# Patient Record
Sex: Female | Born: 2000 | Hispanic: Yes | Marital: Single | Smoking: Never smoker
Health system: Southern US, Community
[De-identification: ages and names within clinical notes are randomized; demographics above are authoritative.]

## PROBLEM LIST (undated history)

## (undated) ENCOUNTER — Inpatient Hospital Stay (HOSPITAL_COMMUNITY): Payer: Self-pay

## (undated) DIAGNOSIS — D649 Anemia, unspecified: Secondary | ICD-10-CM

## (undated) DIAGNOSIS — I440 Atrioventricular block, first degree: Secondary | ICD-10-CM

## (undated) DIAGNOSIS — R011 Cardiac murmur, unspecified: Secondary | ICD-10-CM

## (undated) DIAGNOSIS — E785 Hyperlipidemia, unspecified: Secondary | ICD-10-CM

## (undated) HISTORY — DX: Atrioventricular block, first degree: I44.0

## (undated) HISTORY — DX: Cardiac murmur, unspecified: R01.1

## (undated) HISTORY — DX: Hyperlipidemia, unspecified: E78.5

## (undated) HISTORY — PX: NO PAST SURGERIES: SHX2092

---
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
## 2018-02-21 ENCOUNTER — Encounter: Payer: Self-pay | Admitting: Family Medicine

## 2018-02-21 ENCOUNTER — Encounter: Payer: Self-pay | Admitting: Licensed Clinical Social Worker

## 2018-02-21 ENCOUNTER — Ambulatory Visit (INDEPENDENT_AMBULATORY_CARE_PROVIDER_SITE_OTHER): Payer: Medicaid Other | Admitting: Family Medicine

## 2018-02-21 VITALS — BP 128/62 | HR 65 | Temp 97.8°F | Wt 143.4 lb

## 2018-02-21 DIAGNOSIS — G4700 Insomnia, unspecified: Secondary | ICD-10-CM | POA: Insufficient documentation

## 2018-02-21 DIAGNOSIS — N912 Amenorrhea, unspecified: Secondary | ICD-10-CM | POA: Diagnosis not present

## 2018-02-21 DIAGNOSIS — Z3202 Encounter for pregnancy test, result negative: Secondary | ICD-10-CM

## 2018-02-21 LAB — POCT URINE PREGNANCY: Preg Test, Ur: NEGATIVE

## 2018-02-21 MED ORDER — NORGESTIMATE-ETH ESTRADIOL 0.25-35 MG-MCG PO TABS: 1.0000 | ORAL_TABLET | Freq: Every day | ORAL | 11 refills | Status: DC

## 2018-02-21 NOTE — Assessment & Plan Note (Signed)
Concern for poor sleep hygiene or depression.  [facility].  Will follow up next week

## 2018-02-21 NOTE — Patient Instructions (Signed)
Good to see you today!  Thanks for coming in.  Take the BCP one every day at the same time.  It will not hurt anything if you take them if you might be pregnant  Use condoms every time you have sex  If you are feeling more down then call us immediately  Come back in one week for a check

## 2018-02-21 NOTE — Progress Notes (Signed)
Subjective  Julie Potter is a 17 y.o. female is presenting with the following  MISSED MP Last menstrual period was 5-6. Usually has them every month.  Did have miscarriage in March but regular menstrual periods in April and May.  Is sex active without contraception. No sores or fever or rash [facility]  INSOMNIA For the last few months.  Taking melatonin and brother's anxiety medicaton.  No suicidal ideation but some anxiety and feeling down - see PHQ( + fhx of bipolar  Chief Complaint noted Review of Symptoms - see HPI PMH - Smoking status noted.  No history of blood clots or migraines or bleeding do or smoking   Objective Vital Signs reviewed BP 128/62   Pulse 65   Temp 97.8 F (36.6 C) (Oral)   Wt 143 lb 6.4 oz (65 kg)   LMP 01/15/2018   SpO2 99%   BMI 25.40 kg/m  Psych:  Cognition and judgment appear intact. Alert, communicative  and cooperative with normal attention span and concentration. No apparent delusions, illusions, hallucinations  Assessments/Plans  See after visit summary for details of patient instuctions  Amenorrhea Either irregular menstrual period or undetected pregnancy.  She desires contraception and is at high risk for unintended pregnancy.  Will start BCPs and see back next week for another preg test   Insomnia Concern for poor sleep hygiene or depression.  [facility].  Will follow up next week

## 2018-02-21 NOTE — Assessment & Plan Note (Signed)
Either irregular menstrual period or undetected pregnancy.  She desires contraception and is at high risk for unintended pregnancy.  Will start BCPs and see back next week for another preg test

## 2018-02-21 NOTE — Progress Notes (Signed)
Type of Service: Integrated Behavioral Health Warm Handoff  Julie CoteBritney M Potter is a 17 y.o. female referred by Dr. Modena Morrowhambless for assistance with symptoms of depression and sleep difficulties Patient was accompanied by mother, patient wanted mom to stay in the room. Patient is pleasant and engaged in conversation. Reports :concerns with Not being able to sleep for the past 3 months, feeling down, feeling anxious, having out burst at times, yelling at brother and mood swings.  Patient reports taking 15 mg of melatonin and was only able to sleep 2 to 3 hours.  Mental Health/ Substance Hx: no history of mental health treatment, has family history of anxiety, depression and bipolar. Denies use of alcohol and drugs Depression screen PHQ 2/9 02/21/2018  Decreased Interest 1  Down, Depressed, Hopeless 1  PHQ - 2 Score 2  Altered sleeping 3  Tired, decreased energy 2  Change in appetite 2  Feeling bad or failure about yourself  1  Trouble concentrating 1  Moving slowly or fidgety/restless 1  Suicidal thoughts 0  PHQ-9 Score 12   Life & Social patient lives with mother and brother  Recent llfe changes: none reported Issues discussed:  Integrated care services, support system, previous and current coping skills,  things patient enjoy or use to enjoy doing, sleep hygiene and demonstration of relaxed breathing.    Intervention: Reflective listening, supportive counseling, solutions focus strategies, community resources, ;Psychoeducation ; Sleep Hygiene Assessment/Plan:.Patient is currently experiencing sleep difficulty with symptoms of depression, which are exacerbated by teenage stressors.  Patient may benefit from, and is in agreement to receive further assessment and brief therapeutic interventions to assist with managing  Her symptoms. Patient will implement sleep hygiene, and relaxed breathing.  Will F/U with PCP in one week and LCSW.   Warm Hand Off Completed.      Julie Hineseborah Egypt Marchiano, LCSW Licensed  Clinical Social Worker [facility]   [phone] 4:35 PM

## 2018-02-28 ENCOUNTER — Ambulatory Visit: Payer: Medicaid Other

## 2018-02-28 ENCOUNTER — Ambulatory Visit: Payer: Medicaid Other | Admitting: Family Medicine

## 2018-05-23 DIAGNOSIS — Z113 Encounter for screening for infections with a predominantly sexual mode of transmission: Secondary | ICD-10-CM | POA: Diagnosis not present

## 2018-05-23 DIAGNOSIS — R3129 Other microscopic hematuria: Secondary | ICD-10-CM | POA: Diagnosis not present

## 2018-05-23 DIAGNOSIS — Z3490 Encounter for supervision of normal pregnancy, unspecified, unspecified trimester: Secondary | ICD-10-CM | POA: Diagnosis not present

## 2018-08-06 DIAGNOSIS — N898 Other specified noninflammatory disorders of vagina: Secondary | ICD-10-CM | POA: Diagnosis not present

## 2018-09-12 NOTE — L&D Delivery Note (Addendum)
Patient is 18 y.o. [id number] [redacted]w[redacted]d admitted for SOL. AROM at 1630. No additional augmentation.    Delivery Note At 6:45 PM a viable female was delivered via Vaginal, Spontaneous (Presentation: Cephalic; DOA).  APGAR: 8, 9; weight  pending.   Placenta status: Spontaneous, intact.  Cord: 3VC.  Anesthesia:  epidural Episiotomy: None Lacerations: 2nd degree;Perineal Suture Repair: 3.0 vicryl Est. Blood Loss (mL):  277cc  Mom to postpartum.  Baby to Couplet care / Skin to Skin.  Upon arrival patient was complete and pushing. She pushed with good maternal effort to deliver a healthy baby girl. Baby delivered without difficulty, was noted to have good tone and place on maternal abdomen for oral suctioning, drying and stimulation. Delayed cord clamping performed. Placenta delivered intact with 3V cord. Vaginal canal and perineum was inspected and found to have a second degree perineal laceration which was sutured and left hemostatic. Pitocin was started and uterus massaged until bleeding slowed. Counts of sharps, instruments, and lap pads were all correct.   [name], MD PGY-2 8/30/20207:26 PM   I was present for the entire delivery of baby and placenta and repair and agree with above.  Tamala Julian, [place], [place] 05/12/2019 8:28 PM

## 2018-09-19 DIAGNOSIS — R300 Dysuria: Secondary | ICD-10-CM | POA: Diagnosis not present

## 2018-09-19 DIAGNOSIS — Z3201 Encounter for pregnancy test, result positive: Secondary | ICD-10-CM | POA: Diagnosis not present

## 2018-09-19 DIAGNOSIS — Z3200 Encounter for pregnancy test, result unknown: Secondary | ICD-10-CM | POA: Diagnosis not present

## 2018-09-19 DIAGNOSIS — Z202 Contact with and (suspected) exposure to infections with a predominantly sexual mode of transmission: Secondary | ICD-10-CM | POA: Diagnosis not present

## 2018-09-21 ENCOUNTER — Emergency Department
Admission: EM | Admit: 2018-09-21 | Discharge: 2018-09-21 | Disposition: A | Discharge status: Home / Self Care | Payer: Medicaid Other | Attending: Emergency Medicine | Admitting: Emergency Medicine

## 2018-09-21 ENCOUNTER — Other Ambulatory Visit: Payer: Self-pay

## 2018-09-21 ENCOUNTER — Encounter: Payer: Self-pay | Admitting: Emergency Medicine

## 2018-09-21 DIAGNOSIS — Z5321 Procedure and treatment not carried out due to patient leaving prior to being seen by health care provider: Secondary | ICD-10-CM | POA: Insufficient documentation

## 2018-09-21 DIAGNOSIS — R1031 Right lower quadrant pain: Secondary | ICD-10-CM | POA: Diagnosis not present

## 2018-09-21 LAB — URINALYSIS, COMPLETE (UACMP) WITH MICROSCOPIC
Bacteria, UA: NONE SEEN
Bilirubin Urine: NEGATIVE
Glucose, UA: NEGATIVE mg/dL
KETONES UR: NEGATIVE mg/dL
Nitrite: NEGATIVE
PH: 7 (ref 5.0–8.0)
Protein, ur: NEGATIVE mg/dL
RBC / HPF: >50 RBC/hpf — ABNORMAL HIGH (ref 0–5)
Specific Gravity, Urine: 1.009 (ref 1.005–1.030)

## 2018-09-21 LAB — CBC
HCT: 37.6 % (ref 36.0–46.0)
Hemoglobin: 12.6 g/dL (ref 12.0–15.0)
MCH: 29 pg (ref 26.0–34.0)
MCHC: 33.5 g/dL (ref 30.0–36.0)
MCV: 86.4 fL (ref 80.0–100.0)
NRBC: 0 % (ref 0.0–0.2)
Platelets: 300 10*3/uL (ref 150–400)
RBC: 4.35 MIL/uL (ref 3.87–5.11)
RDW: 12.2 % (ref 11.5–15.5)
WBC: 8.6 10*3/uL (ref 4.0–10.5)

## 2018-09-21 LAB — BASIC METABOLIC PANEL
ANION GAP: 8 (ref 5–15)
BUN: 7 mg/dL (ref 6–20)
CALCIUM: 8.8 mg/dL — AB (ref 8.9–10.3)
CO2: 21 mmol/L — ABNORMAL LOW (ref 22–32)
Chloride: 104 mmol/L (ref 98–111)
Creatinine, Ser: 0.5 mg/dL (ref 0.44–1.00)
GFR calc Af Amer: >60 mL/min (ref >60)
GLUCOSE: 86 mg/dL (ref 70–99)
Potassium: 3.8 mmol/L (ref 3.5–5.1)
Sodium: 133 mmol/L — ABNORMAL LOW (ref 135–145)

## 2018-09-21 LAB — HCG, QUANTITATIVE, PREGNANCY: hCG, Beta Chain, Quant, S: 55387 m[IU]/mL — ABNORMAL HIGH (ref <5)

## 2018-09-21 NOTE — ED Notes (Signed)
Pt is pregnant but mother who is present does not know.

## 2018-09-21 NOTE — ED Notes (Signed)
Pt unable to provide urine specimen at this time

## 2018-09-21 NOTE — ED Triage Notes (Signed)
Pt arrived via POV with mother with reports of right flank pain that started a few days ago, pt states she was seen at a clinic on 1/8 and was told there was blood in her urine and that she was dehydrated, pt states she did not get started on any antibiotics at this time.  Pt states she woke up today vomiting x 1.   Pt also reports burning in throat x 1 week, using OTC meds with no relief.

## 2018-09-25 ENCOUNTER — Inpatient Hospital Stay (HOSPITAL_COMMUNITY): Payer: Medicaid Other

## 2018-09-25 ENCOUNTER — Other Ambulatory Visit: Payer: Self-pay

## 2018-09-25 ENCOUNTER — Encounter (HOSPITAL_COMMUNITY): Payer: Self-pay | Admitting: *Deleted

## 2018-09-25 ENCOUNTER — Inpatient Hospital Stay (HOSPITAL_COMMUNITY)
Admission: AD | Admit: 2018-09-25 | Discharge: 2018-09-25 | Disposition: A | Discharge status: Home / Self Care | Payer: Medicaid Other | Source: Ambulatory Visit | Attending: Family Medicine | Admitting: Family Medicine

## 2018-09-25 DIAGNOSIS — O209 Hemorrhage in early pregnancy, unspecified: Secondary | ICD-10-CM

## 2018-09-25 DIAGNOSIS — Z3A01 Less than 8 weeks gestation of pregnancy: Secondary | ICD-10-CM | POA: Diagnosis not present

## 2018-09-25 DIAGNOSIS — N898 Other specified noninflammatory disorders of vagina: Secondary | ICD-10-CM | POA: Diagnosis present

## 2018-09-25 DIAGNOSIS — Z3491 Encounter for supervision of normal pregnancy, unspecified, first trimester: Secondary | ICD-10-CM

## 2018-09-25 DIAGNOSIS — O26851 Spotting complicating pregnancy, first trimester: Secondary | ICD-10-CM | POA: Diagnosis not present

## 2018-09-25 DIAGNOSIS — O200 Threatened abortion: Secondary | ICD-10-CM | POA: Diagnosis not present

## 2018-09-25 LAB — CBC
HCT: 35.7 % — ABNORMAL LOW (ref 36.0–46.0)
HEMOGLOBIN: 11.9 g/dL — AB (ref 12.0–15.0)
MCH: 29.2 pg (ref 26.0–34.0)
MCHC: 33.3 g/dL (ref 30.0–36.0)
MCV: 87.5 fL (ref 80.0–100.0)
NRBC: 0 % (ref 0.0–0.2)
PLATELETS: 273 10*3/uL (ref 150–400)
RBC: 4.08 MIL/uL (ref 3.87–5.11)
RDW: 12.6 % (ref 11.5–15.5)
WBC: 9.2 10*3/uL (ref 4.0–10.5)

## 2018-09-25 LAB — URINALYSIS, ROUTINE W REFLEX MICROSCOPIC
Bilirubin Urine: NEGATIVE
GLUCOSE, UA: NEGATIVE mg/dL
Ketones, ur: NEGATIVE mg/dL
Nitrite: NEGATIVE
PROTEIN: NEGATIVE mg/dL
SPECIFIC GRAVITY, URINE: 1.012 (ref 1.005–1.030)
pH: 6 (ref 5.0–8.0)

## 2018-09-25 LAB — WET PREP, GENITAL
CLUE CELLS WET PREP: NONE SEEN
Sperm: NONE SEEN
Yeast Wet Prep HPF POC: NONE SEEN

## 2018-09-25 LAB — POCT PREGNANCY, URINE: Preg Test, Ur: POSITIVE — AB

## 2018-09-25 LAB — HCG, QUANTITATIVE, PREGNANCY: hCG, Beta Chain, Quant, S: 102219 m[IU]/mL — ABNORMAL HIGH (ref <5)

## 2018-09-25 MED ORDER — PRENATAL VITAMINS 0.8 MG PO TABS: 1.0000 | ORAL_TABLET | Freq: Every day | ORAL | 12 refills | Status: DC

## 2018-09-25 NOTE — Discharge Instructions (Signed)
[facility] [employer] for [employer] at [facility]       Phone: [phone]  Center for [employer] at [place] Phone: [phone]  Center for [employer] at [place]  Phone: [phone]  Center for [employer] at [employer]  Phone: [phone]  Center for [facility] at [place]  Phone: [phone]  [facility]       Phone: [phone]  [facility]    Phone: [phone]   [facility] [place])    Phone: [phone]  [facility]    Phone: [phone]  [facility]    Phone: [phone]  [facility]    Phone: [phone]  [facility]       Phone: [phone]   [facility]  Phone: [phone]  [facility]    Phone: [phone]  [facility] [place]   Phone: [phone]  [facility]      Phone: [phone]  [facility]    Phone: [phone]    Threatened Miscarriage  A threatened miscarriage is when you have bleeding from your vagina during the first 20 weeks of pregnancy but the pregnancy does not end. Your doctor will do tests to make sure you are still pregnant. The cause of the bleeding may not be known. This condition does not mean your pregnancy will end, but it does increase the risk that it will end (complete miscarriage). Follow these instructions at home:  Get plenty of rest.  If you have bleeding in your vagina, do not have sex or use tampons.  Do not douche.  Do not smoke or use drugs.  Do not drink alcohol.  Avoid caffeine.  Keep all follow-up prenatal visits as told by your doctor. This is important. Contact a doctor if:  You have light bleeding from your vagina.  You have belly pain or cramping.  You have a  fever. Get help right away if:  You have heavy bleeding from your vagina.  You have clots of blood coming from your vagina.  You pass tissue from your vagina.  You have a gush of fluid from your vagina.  You are leaking fluid from your vagina.  You have very bad pain or cramps in your low back or belly.  You have fever, chills, and very bad belly pain. Summary  A threatened miscarriage is when you have bleeding from your vagina during the first 20 weeks of pregnancy but the pregnancy does not end.  This condition does not mean your pregnancy will end, but it does increase the risk that it will end (complete miscarriage).  Get plenty of rest. If you have bleeding in your vagina, do not have sex or use tampons.  Keep all follow-up prenatal visits as told by your doctor. This is important. This information is not intended to replace advice given to you by your health care provider. Make sure you discuss any questions you have with your health care provider. Document Released: 08/11/2008 Document Revised: 11/25/2016 Document Reviewed: 11/25/2016 Elsevier Interactive Patient Education  2019 [employer].

## 2018-09-25 NOTE — MAU Provider Note (Addendum)
[facility] Provider Note   CSN: [id number] Arrival date & time: 09/25/18  1849     History   Chief Complaint No chief complaint on file.   HPI Julie Potter is a 18 y.o. who presents to the MAU with concern about increased discharge. Patient reports having 2 abortions last year. She is concerned that something is wrong. Patient reports going to the [facility] emergency clinic 09/19/2018 and had STD testing that was negative. Patient reports that 2 days later 1/10 she was evaluated in the ED at [facility] for n/v. She reports they did not give her medications or do any treatment in the ED just d/c her home with instructions to PO hydrate. Patient c/o low back pain and vaginal spotting this morning. Last sexual intercourse was this morning.   HPI  Past Medical History:  Diagnosis Date  . Heart murmur     Patient Active Problem List   Diagnosis Date Noted  . Amenorrhea 02/21/2018  . Insomnia 02/21/2018  . CARDIAC MURMUR 11/29/2007    No past surgical history on file.   OB History    Gravida  3   Para      Term      Preterm      AB  2   Living  0     SAB      TAB  2   Ectopic      Multiple      Live Births               Home Medications    Prior to Admission medications   Medication Sig Start Date End Date Taking? Authorizing Provider  famotidine (PEPCID) 20 MG tablet Take 1 tablet (20 mg total) daily by mouth. 07/19/17   Carney Living, MD  norgestimate-ethinyl estradiol (ORTHO-CYCLEN,SPRINTEC,PREVIFEM) 0.25-35 MG-MCG tablet Take 1 tablet by mouth daily. 02/21/18   Carney Living, MD    Family History Family History  Problem Relation Age of Onset  . Hyperlipidemia Mother   . Hyperlipidemia Maternal Grandmother   . COPD Maternal Grandmother   . Heart disease Maternal Grandmother   . Diabetes Paternal Grandfather     Social History Social History   Tobacco Use  . Smoking status: Passive Smoke Exposure - Never  Smoker  . Smokeless tobacco: Never Used  Substance Use Topics  . Alcohol use: No  . Drug use: No     Allergies   Patient has no known allergies.   Review of Systems Review of Systems  Constitutional: Negative for chills and fever.  HENT: Negative.   Eyes: Negative for visual disturbance.  Respiratory: Negative for cough and shortness of breath.   Cardiovascular: Negative for chest pain.  Gastrointestinal: Negative for abdominal pain, nausea and vomiting.  Genitourinary: Positive for vaginal discharge. Negative for dysuria, frequency and urgency. Vaginal bleeding: pink spotting.     Physical Exam Updated Vital Signs BP (!) 125/57 (BP Location: Right Arm)   Pulse 66   Temp 98.4 F (36.9 C) (Oral)   Resp 16   Ht 5\' 4"  (1.626 m)   Wt 61.8 kg   LMP 07/29/2018 (Exact Date)   SpO2 100%   BMI 23.39 kg/m   Physical Exam Vitals signs and nursing note reviewed.  Constitutional:      Appearance: She is well-developed.  HENT:     Head: Normocephalic.  Eyes:     Conjunctiva/sclera: Conjunctivae normal.  Neck:     Musculoskeletal: Neck supple.  Cardiovascular:     Rate and Rhythm: Normal rate.  Pulmonary:     Effort: Pulmonary effort is normal.  Abdominal:     Palpations: Abdomen is soft.     Tenderness: There is no abdominal tenderness.  Genitourinary:    Comments: External genitalia without lesions, yellow d/c vaginal vault, no blood noted, no CMT, no adnexal tenderness, uterus slightly enlarged.  Musculoskeletal: Normal range of motion.  Skin:    General: Skin is warm and dry.  Neurological:     Mental Status: She is alert and oriented to person, place, and time.     Cranial Nerves: No cranial nerve deficit.  Psychiatric:        Mood and Affect: Mood normal.      ED Treatments / Results  Labs (all labs ordered are listed, but only abnormal results are displayed) Labs Reviewed  WET PREP, GENITAL - Abnormal; Notable for the following components:      Result  Value   Trich, Wet Prep PRESENT (*)    WBC, Wet Prep HPF POC MANY (*)    All other components within normal limits  URINALYSIS, ROUTINE W REFLEX MICROSCOPIC - Abnormal; Notable for the following components:   Hgb urine dipstick LARGE (*)    Leukocytes, UA MODERATE (*)    Bacteria, UA RARE (*)    All other components within normal limits  CBC - Abnormal; Notable for the following components:   Hemoglobin 11.9 (*)    HCT 35.7 (*)    All other components within normal limits  HCG, QUANTITATIVE, PREGNANCY - Abnormal; Notable for the following components:   hCG, Beta Chain, Quant, S 102,219 (*)    All other components within normal limits  POCT PREGNANCY, URINE - Abnormal; Notable for the following components:   Preg Test, Ur POSITIVE (*)    All other components within normal limits  ABO/RH  A pos Radiology [place] Ob Less Than 14 Weeks With Ob Transvaginal  Result Date: 09/25/2018 CLINICAL DATA:  18 year old female with back pain for 2 days and light spotting. Estimated gestational age by LMP 8 weeks 2 days. EXAM: OBSTETRIC <14 WK US AND TRANSVAGINAL OB US TECHNIQUE: Both transabdominal and transvaginal ultrasound examinations were performed for complete evaluation of the gestation as well as the maternal uterus, adnexal regions, and pelvic cul-de-sac. Transvaginal technique was performed to assess early pregnancy. COMPARISON:  None. FINDINGS: Intrauterine gestational sac: Single Yolk sac:  Visible Embryo:  Visible Cardiac Activity: Detected Heart Rate: 132 bpm CRL:  7.5 mm   6 w   4 d                  US EDC: 05/17/2019 Subchorionic hemorrhage:  None visualized. Maternal uterus/adnexae: The right ovary appears normal measuring 2.6 x 3.7 x 1.8 cm. The left ovary appears normal measuring 3.1 x 3.7 x 2.1 centimeters. No pelvic free fluid. IMPRESSION: Single living IUP demonstrated. No acute maternal findings visualized. Electronically Signed   By: Odessa FlemingH  Hall M.D.   On: 09/25/2018 20:58     Procedures Procedures (including critical care time)  Initial Impression / Assessment and Plan / ED Course  I have reviewed the triage vital signs and the nursing notes. 18 y.o. female @ 6.[redacted] weeks gestation stable for d/c with IUP visible on u/s. Patient to start prenatal care. Dr. Shawnie PonsPratt to assume care of patient. Patient awaiting lab results. A pos--ok for discharge Begin [facility] OB provider list given  Final Clinical Impressions(s) / ED Diagnoses  Normal IUP (intrauterine pregnancy) on prenatal ultrasound, first trimester - Plan: Discharge patient  Bleeding in early pregnancy - Plan: US OB LESS THAN 14 WEEKS WITH OB TRANSVAGINAL, US OB LESS THAN 14 WEEKS WITH OB TRANSVAGINAL  Threatened abortion  Allergies as of 09/25/2018   No Known Allergies     Medication List    STOP taking these medications   famotidine 20 MG tablet Commonly known as:  PEPCID   norgestimate-ethinyl estradiol 0.25-35 MG-MCG tablet Commonly known as:  ORTHO-CYCLEN,SPRINTEC,PREVIFEM     TAKE these medications   Prenatal Vitamins 0.8 MG tablet Take 1 tablet by mouth daily.

## 2018-09-25 NOTE — MAU Note (Addendum)
Thought she had an STD, because she a lot of d/c - had an odor.   Was seen, cultures were neg, preg confirmed.  Her mom is concerned,  Because she had 2 abortions last year, thought it might be the plug.  They just think something has to wrong because of all the d/c. No itching or vag pain.  Is having a lot of pain in her low back, started 2 days, mostly when she is sitting down.  This morning she had some light spotting, none now.  Slight left sided CVA tenderness.

## 2018-09-26 LAB — ABO/RH: ABO/RH(D): A POS

## 2018-11-08 ENCOUNTER — Other Ambulatory Visit: Payer: Self-pay

## 2018-11-08 ENCOUNTER — Inpatient Hospital Stay (HOSPITAL_COMMUNITY)
Admission: AD | Admit: 2018-11-08 | Discharge: 2018-11-08 | Disposition: A | Discharge status: Home / Self Care | Payer: Medicaid Other | Attending: Obstetrics & Gynecology | Admitting: Obstetrics & Gynecology

## 2018-11-08 ENCOUNTER — Encounter (HOSPITAL_COMMUNITY): Payer: Self-pay

## 2018-11-08 DIAGNOSIS — B9689 Other specified bacterial agents as the cause of diseases classified elsewhere: Secondary | ICD-10-CM | POA: Diagnosis not present

## 2018-11-08 DIAGNOSIS — R109 Unspecified abdominal pain: Secondary | ICD-10-CM | POA: Diagnosis not present

## 2018-11-08 DIAGNOSIS — Z3A12 12 weeks gestation of pregnancy: Secondary | ICD-10-CM | POA: Insufficient documentation

## 2018-11-08 DIAGNOSIS — Z79899 Other long term (current) drug therapy: Secondary | ICD-10-CM | POA: Diagnosis not present

## 2018-11-08 DIAGNOSIS — N760 Acute vaginitis: Secondary | ICD-10-CM

## 2018-11-08 DIAGNOSIS — Z7722 Contact with and (suspected) exposure to environmental tobacco smoke (acute) (chronic): Secondary | ICD-10-CM | POA: Diagnosis not present

## 2018-11-08 DIAGNOSIS — O9989 Other specified diseases and conditions complicating pregnancy, childbirth and the puerperium: Secondary | ICD-10-CM | POA: Insufficient documentation

## 2018-11-08 DIAGNOSIS — O98811 Other maternal infectious and parasitic diseases complicating pregnancy, first trimester: Secondary | ICD-10-CM | POA: Diagnosis not present

## 2018-11-08 DIAGNOSIS — O26891 Other specified pregnancy related conditions, first trimester: Secondary | ICD-10-CM | POA: Diagnosis not present

## 2018-11-08 DIAGNOSIS — O26899 Other specified pregnancy related conditions, unspecified trimester: Secondary | ICD-10-CM

## 2018-11-08 DIAGNOSIS — Z833 Family history of diabetes mellitus: Secondary | ICD-10-CM | POA: Diagnosis not present

## 2018-11-08 LAB — URINALYSIS, ROUTINE W REFLEX MICROSCOPIC
Bilirubin Urine: NEGATIVE
GLUCOSE, UA: NEGATIVE mg/dL
Ketones, ur: NEGATIVE mg/dL
Nitrite: NEGATIVE
PROTEIN: NEGATIVE mg/dL
Specific Gravity, Urine: 1.02 (ref 1.005–1.030)
pH: 6 (ref 5.0–8.0)

## 2018-11-08 LAB — WET PREP, GENITAL
Sperm: NONE SEEN
Trich, Wet Prep: NONE SEEN
Yeast Wet Prep HPF POC: NONE SEEN

## 2018-11-08 MED ORDER — METRONIDAZOLE 500 MG PO TABS: 500.0000 mg | ORAL_TABLET | Freq: Two times a day (BID) | ORAL | 0 refills | Status: AC

## 2018-11-08 NOTE — Discharge Instructions (Signed)
Abdominal Pain During Pregnancy  Belly (abdominal) pain is common during pregnancy. There are many possible causes. Most of the time, it is not a serious problem. Other times, it can be a sign that something is wrong with the pregnancy. Always tell your doctor if you have belly pain. Follow these instructions at home:  Do not have sex or put anything in your vagina until your pain goes away completely.  Get plenty of rest until your pain gets better.  Drink enough fluid to keep your pee (urine) pale yellow.  Take over-the-counter and prescription medicines only as told by your doctor.  Keep all follow-up visits as told by your doctor. This is important. Contact a doctor if:  Your pain continues or gets worse after resting.  You have lower belly pain that: ? Comes and goes at regular times. ? Spreads to your back. ? Feels like menstrual cramps.  You have pain or burning when you pee (urinate). Get help right away if:  You have a fever or chills.  You have vaginal bleeding.  You are leaking fluid from your vagina.  You are passing tissue from your vagina.  You throw up (vomit) for more than 24 hours.  You have watery poop (diarrhea) for more than 24 hours.  Your baby is moving less than usual.  You feel very weak or faint.  You have shortness of breath.  You have very bad pain in your upper belly. Summary  Belly (abdominal) pain is common during pregnancy. There are many possible causes.  If you have belly pain during pregnancy, tell your doctor right away.  Keep all follow-up visits as told by your doctor. This is important. This information is not intended to replace advice given to you by your health care provider. Make sure you discuss any questions you have with your health care provider. Document Released: 08/17/2009 Document Revised: 12/01/2016 Document Reviewed: 12/01/2016 Elsevier Interactive Patient Education  2019 Elsevier Inc.  Bacterial  Vaginosis  Bacterial vaginosis is an infection of the vagina. It happens when too many normal germs (healthy bacteria) grow in the vagina. This infection puts you at risk for infections from sex (STIs). Treating this infection can lower your risk for some STIs. You should also treat this if you are pregnant. It can cause your baby to be born early. Follow these instructions at home: Medicines  Take over-the-counter and prescription medicines only as told by your doctor.  Take or use your antibiotic medicine as told by your doctor. Do not stop taking or using it even if you start to feel better. General instructions  If you your sexual partner is a woman, tell her that you have this infection. She needs to get treatment if she has symptoms. If you have a female partner, he does not need to be treated.  During treatment: ? Avoid sex. ? Do not douche. ? Avoid alcohol as told. ? Avoid breastfeeding as told.  Drink enough fluid to keep your pee (urine) clear or pale yellow.  Keep your vagina and butt (rectum) clean. ? Wash the area with warm water every day. ? Wipe from front to back after you use the toilet.  Keep all follow-up visits as told by your doctor. This is important. Preventing this condition  Do not douche.  Use only warm water to wash around your vagina.  Use protection when you have sex. This includes: ? Latex condoms. ? Dental dams.  Limit how many people you have sex with.  It is best to only have sex with the same person (be monogamous).  Get tested for STIs. Have your partner get tested.  Wear underwear that is cotton or lined with cotton.  Avoid tight pants and pantyhose. This is most important in summer.  Do not use any products that have nicotine or tobacco in them. These include cigarettes and e-cigarettes. If you need help quitting, ask your doctor.  Do not use illegal drugs.  Limit how much alcohol you drink. Contact a doctor if:  Your symptoms do  not get better, even after you are treated.  You have more discharge or pain when you pee (urinate).  You have a fever.  You have pain in your belly (abdomen).  You have pain with sex.  Your bleed from your vagina between periods. Summary  This infection happens when too many germs (bacteria) grow in the vagina.  Treating this condition can lower your risk for some infections from sex (STIs).  You should also treat this if you are pregnant. It can cause early (premature) birth.  Do not stop taking or using your antibiotic medicine even if you start to feel better. This information is not intended to replace advice given to you by your health care provider. Make sure you discuss any questions you have with your health care provider. Document Released: 06/07/2008 Document Revised: 05/14/2016 Document Reviewed: 05/14/2016 Elsevier Interactive Patient Education  2019 [employer].  Prenatal Care Providers [facility]    [facility]  [phone]     Phone: [phone]          [facility]                      [facility]  @[address]     Phone: [phone]  Phone: [phone]         [facility]     Phone: [phone]  Phone: [phone]           [facility] [place]                hone: [phone]  Phone: [phone]         [facility] Dr. Francoise Ceo      Phone: [phone]  Phone: [phone]         [facility] Dept.                Phone: [phone]  [facility]   [address] [place])          Phone: [phone] [facility]   Phone: [phone]

## 2018-11-08 NOTE — MAU Note (Signed)
Pt presents to MAU with c/o lower abdominal and back pain that started x 5 days. Pt denies VB.

## 2018-11-08 NOTE — MAU Provider Note (Addendum)
History     CSN: [id number]  Arrival date and time: 11/08/18 1803   History obtained from patient.     Chief Complaint  Patient presents with  . Abdominal Pain  . Back Pain   Abdominal Pain  Pertinent negatives include no constipation, diarrhea, dysuria, fever, hematuria or nausea.  Back Pain  Associated symptoms include abdominal pain. Pertinent negatives include no chest pain, dysuria or fever.    Julie Potter [id number] is a 18 year old female who presents to the MAU with a chief complaint of bilateral lower quadrant pain, low back pain, and white malodorous vaginal discharge. Patient is [redacted]w[redacted]d weeks pregnant based on ultrasound performed on 09/25/2018. Pain is intermittent and described as "cramp like." Rates pain 6/10. Patient has taken tylenol extra strength with some relief. Tylenol brings her pain level down to 2/10. Patient states that the malodorous white discharge has been and ongoing problem since December 2019. Patient states that she went to an urgent care in December to get evaluated for an STD, and was told that everything came back normal. Patient denies fever, chills, nausea, vomiting, flank pain, dysuria, and urgency.   IUP confirmed on [place] 09/25/2018. Patient is taking prenatal vitamins and states that she has established prenatal care.    OB History    Gravida  3   Para      Term      Preterm      AB  2   Living  0     SAB      TAB  2   Ectopic      Multiple      Live Births              Past Medical History:  Diagnosis Date  . Heart murmur     History reviewed. No pertinent surgical history.  Family History  Problem Relation Age of Onset  . Hyperlipidemia Mother   . Hyperlipidemia Maternal Grandmother   . COPD Maternal Grandmother   . Heart disease Maternal Grandmother   . Diabetes Paternal Grandfather     Social History   Tobacco Use  . Smoking status: Passive Smoke Exposure - Never Smoker  . Smokeless tobacco: Never Used   Substance Use Topics  . Alcohol use: No  . Drug use: No    Allergies: No Known Allergies  Medications Prior to Admission  Medication Sig Dispense Refill Last Dose  . Prenatal Multivit-Min-Fe-FA (PRENATAL VITAMINS) 0.8 MG tablet Take 1 tablet by mouth daily. 30 tablet 12    Results for orders placed or performed during the hospital encounter of 11/08/18 (from the past 48 hour(s))  Urinalysis, Routine w reflex microscopic     Status: Abnormal   Collection Time: 11/08/18  6:33 PM  Result Value Ref Range   Color, Urine YELLOW YELLOW   APPearance HAZY (A) CLEAR   Specific Gravity, Urine 1.020 1.005 - 1.030   pH 6.0 5.0 - 8.0   Glucose, UA NEGATIVE NEGATIVE mg/dL   Hgb urine dipstick LARGE (A) NEGATIVE   Bilirubin Urine NEGATIVE NEGATIVE   Ketones, ur NEGATIVE NEGATIVE mg/dL   Protein, ur NEGATIVE NEGATIVE mg/dL   Nitrite NEGATIVE NEGATIVE   Leukocytes,Ua MODERATE (A) NEGATIVE   RBC / HPF 0-5 0 - 5 RBC/hpf   WBC, UA 11-20 0 - 5 WBC/hpf   Bacteria, UA RARE (A) NONE SEEN   Squamous Epithelial / LPF 11-20 0 - 5   Mucus PRESENT     Comment: Performed  at [facility], [address] [place], [place] [address]  Wet prep, genital     Status: Abnormal   Collection Time: 11/08/18  7:21 PM  Result Value Ref Range   Yeast Wet Prep HPF POC NONE SEEN NONE SEEN   Trich, Wet Prep NONE SEEN NONE SEEN   Clue Cells Wet Prep HPF POC PRESENT (A) NONE SEEN   WBC, Wet Prep HPF POC MANY (A) NONE SEEN    Comment: MANY BACTERIA SEEN   Sperm NONE SEEN     Comment: Performed at [facility], [address] [place], [place] [address]   Review of Systems  Constitutional: Negative for chills and fever.  Respiratory: Negative for shortness of breath.   Cardiovascular: Negative for chest pain.  Gastrointestinal: Positive for abdominal pain. Negative for constipation, diarrhea and nausea.  Genitourinary: Positive for vaginal discharge (white and malodorous). Negative for dysuria, flank pain,  hematuria, urgency and vaginal bleeding.  Musculoskeletal: Positive for back pain.  Neurological: Negative for dizziness.   Physical Exam   Blood pressure (!) 135/55, pulse 83, temperature 97.8 F (36.6 C), temperature source Oral, resp. rate 18, height 5\' 4"  (1.626 m), weight 63.5 kg, last menstrual period 07/29/2018, SpO2 100 %.  Physical Exam  Constitutional: She appears well-developed and well-nourished. No distress.  GI: Soft. She exhibits no distension. There is no abdominal tenderness. There is no rebound and no guarding.  Skin: She is not diaphoretic.    MAU Course  Procedures  None  MDM   Assessment and Plan    Julie Potter 11/08/2018, 8:42 PM   I confirm that I have verified the information documented in the physician assistant student's note and that I have also personally reperformed the history, physical exam and all medical decision making activities of this service and have verified that all service and findings are accurately documented in this student's note.   (add any additional history, PE or MDM you re-performed)  Julie Potter, Julie Rutherford, NP 11/08/2018 8:42 PM   SIUP@ 12 weeks + fetal heart tones via doppler  A positive blood type  Abdomen non tender Cervix closed, thick posterior. Wet prep, GC and urine culture collected  A:  1. Abdominal pain during pregnancy, antepartum   2. Bacterial vaginosis     P:  Discharge home in stable condition Return to MAU if symptoms worsen F/u with OB as directed

## 2018-11-09 LAB — CULTURE, OB URINE
CULTURE: NO GROWTH
SPECIAL REQUESTS: NORMAL

## 2018-11-09 LAB — GC/CHLAMYDIA PROBE AMP (~~LOC~~) NOT AT ARMC
Chlamydia: NEGATIVE
Neisseria Gonorrhea: NEGATIVE

## 2018-11-29 ENCOUNTER — Encounter: Payer: Self-pay | Admitting: Certified Nurse Midwife

## 2018-11-29 ENCOUNTER — Other Ambulatory Visit: Payer: Self-pay

## 2018-11-29 ENCOUNTER — Ambulatory Visit (INDEPENDENT_AMBULATORY_CARE_PROVIDER_SITE_OTHER): Payer: Medicaid Other | Admitting: Certified Nurse Midwife

## 2018-11-29 VITALS — BP 111/64 | HR 84 | Wt 136.0 lb

## 2018-11-29 DIAGNOSIS — Z23 Encounter for immunization: Secondary | ICD-10-CM

## 2018-11-29 DIAGNOSIS — Z3480 Encounter for supervision of other normal pregnancy, unspecified trimester: Secondary | ICD-10-CM | POA: Diagnosis not present

## 2018-11-29 DIAGNOSIS — Z3A15 15 weeks gestation of pregnancy: Secondary | ICD-10-CM

## 2018-11-29 DIAGNOSIS — Z3482 Encounter for supervision of other normal pregnancy, second trimester: Secondary | ICD-10-CM | POA: Diagnosis not present

## 2018-11-29 NOTE — Progress Notes (Signed)
History:   Julie Potter is a 18 y.o. G3P0020 at [redacted]w[redacted]d by LMP being seen today for her first obstetrical visit.  Her obstetrical history is significant for none. Patient does intend to breast feed. Pregnancy history fully reviewed.  Patient reports no complaints.  HISTORY: OB History  Gravida Para Term Preterm AB Living  3 0 0 0 2 0  SAB TAB Ectopic Multiple Live Births  0 2 0 0 0    # Outcome Date GA Lbr Len/2nd Weight Sex Delivery Anes PTL Lv  3 Current           2 TAB           1 TAB             She is <21yo, does not need pap smear   Past Medical History:  Diagnosis Date  . Heart murmur    Past Surgical History:  Procedure Laterality Date  . NO PAST SURGERIES     Family History  Problem Relation Age of Onset  . Hyperlipidemia Mother   . Hyperlipidemia Maternal Grandmother   . COPD Maternal Grandmother   . Heart disease Maternal Grandmother   . Diabetes Paternal Grandfather    Social History   Tobacco Use  . Smoking status: Passive Smoke Exposure - Never Smoker  . Smokeless tobacco: Never Used  Substance Use Topics  . Alcohol use: No  . Drug use: No   No Known Allergies Current Outpatient Medications on File Prior to Visit  Medication Sig Dispense Refill  . Prenatal Multivit-Min-Fe-FA (PRENATAL VITAMINS) 0.8 MG tablet Take 1 tablet by mouth daily. 30 tablet 12   No current facility-administered medications on file prior to visit.     Review of Systems Pertinent items noted in HPI and remainder of comprehensive ROS otherwise negative. Physical Exam:   Vitals:   11/29/18 1510  BP: 111/64  Pulse: 84  Weight: 136 lb (61.7 kg)   Fetal Heart Rate (bpm): 150 System: General: well-developed, well-nourished female in no acute distress   Skin: normal coloration and turgor, no rashes   Neurologic: oriented, normal, negative, normal mood   Extremities: normal strength, tone, and muscle mass, ROM of all joints is normal   HEENT PERRLA, extraocular  movement intact and sclera clear, anicteric   Mouth/Teeth mucous membranes moist, pharynx normal without lesions and dental hygiene good   Neck supple and no masses   Cardiovascular: regular rate and rhythm   Respiratory:  no respiratory distress, normal breath sounds   Abdomen: soft, non-tender; bowel sounds normal; no masses,  no organomegaly   Assessment:    Pregnancy: [id number] Patient Active Problem List   Diagnosis Date Noted  . Supervision of other normal pregnancy, antepartum 11/29/2018  . Amenorrhea 02/21/2018  . Insomnia 02/21/2018  . CARDIAC MURMUR 11/29/2007     Plan:    1. Supervision of other normal pregnancy, antepartum - Routine prenatal care  - Anticipatory guidance on upcoming appointment  - Educated and discussed use of babyscripts  - recent wet prep and GC/C at hospital not needed today  - Obstetric Panel, Including HIV - Genetic Screening - Enroll Patient in Babyscripts - [place] MFM OB COMP + 14 WK; Future - AFP, Serum, Open Spina Bifida - HgB A1c  2. Need for immunization against influenza - Flu Vaccine QUAD 36+ mos IM  Initial labs drawn. Continue prenatal vitamins. Genetic Screening discussed, NIPS: ordered. Ultrasound discussed; fetal anatomic survey: ordered. Problem list reviewed and updated. The  nature of [facility] with multiple MDs and other Advanced Practice Providers was explained to patient; also emphasized that residents, students are part of our team. Routine obstetric precautions reviewed. Return in about 4 weeks (around 12/27/2018) for ROB.     Sharyon Cable, CNM [facility] [employer], [facility]

## 2018-11-29 NOTE — Patient Instructions (Addendum)
Safe Medications in Pregnancy   Acne: Benzoyl Peroxide Salicylic Acid  Backache/Headache: Tylenol: 2 regular strength every 4 hours OR              2 Extra strength every 6 hours  Colds/Coughs/Allergies: Benadryl (alcohol free) 25 mg every 6 hours as needed Breath right strips Claritin Cepacol throat lozenges Chloraseptic throat spray Cold-Eeze- up to three times per day Cough drops, alcohol free Flonase (by prescription only) Guaifenesin Mucinex Robitussin DM (plain only, alcohol free) Saline nasal spray/drops Sudafed (pseudoephedrine) & Actifed ** use only after [redacted] weeks gestation and if you do not have high blood pressure Tylenol Vicks Vaporub Zinc lozenges Zyrtec   Constipation: Colace Ducolax suppositories Fleet enema Glycerin suppositories Metamucil Milk of magnesia Miralax Senokot Smooth move tea  Diarrhea: Kaopectate Imodium A-D  *NO pepto Bismol  Hemorrhoids: Anusol Anusol HC Preparation H Tucks  Indigestion: Tums Maalox Mylanta Zantac  Pepcid  Insomnia: Benadryl (alcohol free) 25mg  every 6 hours as needed Tylenol PM Unisom, no Gelcaps  Leg Cramps: Tums MagGel  Nausea/Vomiting:  Bonine Dramamine Emetrol Ginger extract Sea bands Meclizine  Nausea medication to take during pregnancy:  Unisom (doxylamine succinate 25 mg tablets) Take one tablet daily at bedtime. If symptoms are not adequately controlled, the dose can be increased to a maximum recommended dose of two tablets daily (1/2 tablet in the morning, 1/2 tablet mid-afternoon and one at bedtime). Vitamin B6 100mg  tablets. Take one tablet twice a day (up to 200 mg per day).  Skin Rashes: Aveeno products Benadryl cream or 25mg  every 6 hours as needed Calamine Lotion 1% cortisone cream  Yeast infection: Gyne-lotrimin 7 Monistat 7   **If taking multiple medications, please check labels to avoid duplicating the same active ingredients **take medication as directed on  the label ** Do not exceed 4000 mg of tylenol in 24 hours **Do not take medications that contain aspirin or ibuprofen    Influenza (Flu) Vaccine (Inactivated or Recombinant): What You Need to Know  1. Why get vaccinated? Influenza vaccine can prevent influenza (flu). Flu is a contagious disease that spreads around the [place] every year, usually between October and May. Anyone can get the flu, but it is more dangerous for some people. Infants and young children, people 35 years of age and older, pregnant women, and people with certain health conditions or a weakened immune system are at greatest risk of flu complications. Pneumonia, bronchitis, sinus infections and ear infections are examples of flu-related complications. If you have a medical condition, such as heart disease, cancer or diabetes, flu can make it worse. Flu can cause fever and chills, sore throat, muscle aches, fatigue, cough, headache, and runny or stuffy nose. Some people may have vomiting and diarrhea, though this is more common in children than adults. Each year thousands of people in the [place] die from flu, and many more are hospitalized. Flu vaccine prevents millions of illnesses and flu-related visits to the doctor each year. 2. Influenza vaccine CDC recommends everyone 43 months of age and older get vaccinated every flu season. Children 6 months through 1 years of age may need 2 doses during a single flu season. Everyone else needs only 1 dose each flu season. It takes about 2 weeks for protection to develop after vaccination. There are many flu viruses, and they are always changing. Each year a new flu vaccine is made to protect against three or four viruses that are likely to cause disease in the upcoming flu season.  Even when the vaccine doesn't exactly match these viruses, it may still provide some protection. Influenza vaccine does not cause flu. Influenza vaccine may be given at the same time as other  vaccines. 3. Talk with your health care provider Tell your vaccine provider if the person getting the vaccine:  Has had an allergic reaction after a previous dose of influenza vaccine, or has any severe, life-threatening allergies.  Has ever had Guillain-Barr Syndrome (also called GBS). In some cases, your health care provider may decide to postpone influenza vaccination to a future visit. People with minor illnesses, such as a cold, may be vaccinated. People who are moderately or severely ill should usually wait until they recover before getting influenza vaccine. Your health care provider can give you more information. 4. Risks of a vaccine reaction  Soreness, redness, and swelling where shot is given, fever, muscle aches, and headache can happen after influenza vaccine.  There may be a very small increased risk of Guillain-Barr Syndrome (GBS) after inactivated influenza vaccine (the flu shot). Young children who get the flu shot along with pneumococcal vaccine (PCV13), and/or DTaP vaccine at the same time might be slightly more likely to have a seizure caused by fever. Tell your health care provider if a child who is getting flu vaccine has ever had a seizure. People sometimes faint after medical procedures, including vaccination. Tell your provider if you feel dizzy or have vision changes or ringing in the ears. As with any medicine, there is a very remote chance of a vaccine causing a severe allergic reaction, other serious injury, or death. 5. What if there is a serious problem? An allergic reaction could occur after the vaccinated person leaves the clinic. If you see signs of a severe allergic reaction (hives, swelling of the face and throat, difficulty breathing, a fast heartbeat, dizziness, or weakness), call 9-1-1 and get the person to the nearest hospital. For other signs that concern you, call your health care provider. Adverse reactions should be reported to the Vaccine Adverse  Event Reporting System (VAERS). Your health care provider will usually file this report, or you can do it yourself. Visit the VAERS website at www.vaers.hhs.gov or call 1-800-822-7967.VAERS is only for reporting reactions, and VAERS staff do not give medical advice. 6. The National Vaccine Injury Compensation Program The National Vaccine Injury Compensation Program (VICP) is a federal program that was created to compensate people who may have been injured by certain vaccines. Visit the VICP website at www.hrsa.gov/vaccinecompensation or call 1-800-338-2382 to learn about the program and about filing a claim. There is a time limit to file a claim for compensation. 7. How can I learn more?  Ask your healthcare provider.  Call your local or state health department.  Contact the Centers for Disease Control and Prevention (CDC): ? Call 1-800-232-4636 (1-800-CDC-INFO) or ? Visit CDC's www.cdc.gov/flu Vaccine Information Statement (Interim) Inactivated Influenza Vaccine (04/26/2018) This information is not intended to replace advice given to you by your health care provider. Make sure you discuss any questions you have with your health care provider. Document Released: 06/23/2006 Document Revised: 04/30/2018 Document Reviewed: 04/30/2018 Elsevier Interactive Patient Education  2019 Elsevier Inc.  

## 2018-12-01 LAB — AFP, SERUM, OPEN SPINA BIFIDA
AFP MoM: 0.7
AFP Value: 23.4 ng/mL
Gest. Age on Collection Date: 15.6 weeks
Maternal Age At EDD: 20.2 yr
OSBR Risk 1 IN: 10000
Test Results:: NEGATIVE
Weight: 136 [lb_av]

## 2018-12-01 LAB — OBSTETRIC PANEL, INCLUDING HIV
Antibody Screen: NEGATIVE
Basophils Absolute: 0.1 10*3/uL (ref 0.0–0.2)
Basos: 1 %
EOS (ABSOLUTE): 0.1 10*3/uL (ref 0.0–0.4)
Eos: 1 %
HIV Screen 4th Generation wRfx: NONREACTIVE
Hematocrit: 33.7 % — ABNORMAL LOW (ref 34.0–46.6)
Hemoglobin: 11.9 g/dL (ref 11.1–15.9)
Hepatitis B Surface Ag: NEGATIVE
Immature Grans (Abs): 0 10*3/uL (ref 0.0–0.1)
Immature Granulocytes: 0 %
Lymphocytes Absolute: 1.9 10*3/uL (ref 0.7–3.1)
Lymphs: 21 %
MCH: 30.5 pg (ref 26.6–33.0)
MCHC: 35.3 g/dL (ref 31.5–35.7)
MCV: 86 fL (ref 79–97)
Monocytes Absolute: 0.5 10*3/uL (ref 0.1–0.9)
Monocytes: 6 %
Neutrophils Absolute: 6.4 10*3/uL (ref 1.4–7.0)
Neutrophils: 71 %
Platelets: 296 10*3/uL (ref 150–450)
RBC: 3.9 x10E6/uL (ref 3.77–5.28)
RDW: 12.7 % (ref 11.7–15.4)
RPR Ser Ql: NONREACTIVE
Rh Factor: POSITIVE
Rubella Antibodies, IGG: 2.8 index (ref >0.99)
WBC: 9 10*3/uL (ref 3.4–10.8)

## 2018-12-01 LAB — HEMOGLOBIN A1C
Est. average glucose Bld gHb Est-mCnc: 94 mg/dL
Hgb A1c MFr Bld: 4.9 % (ref 4.8–5.6)

## 2018-12-06 ENCOUNTER — Encounter: Payer: Self-pay | Admitting: Certified Nurse Midwife

## 2018-12-10 ENCOUNTER — Encounter: Payer: Self-pay | Admitting: Certified Nurse Midwife

## 2018-12-21 ENCOUNTER — Ambulatory Visit (HOSPITAL_COMMUNITY): Payer: Medicaid Other | Attending: Obstetrics and Gynecology

## 2018-12-31 DIAGNOSIS — Z0389 Encounter for observation for other suspected diseases and conditions ruled out: Secondary | ICD-10-CM | POA: Diagnosis not present

## 2018-12-31 DIAGNOSIS — Z113 Encounter for screening for infections with a predominantly sexual mode of transmission: Secondary | ICD-10-CM | POA: Diagnosis not present

## 2018-12-31 DIAGNOSIS — Z3009 Encounter for other general counseling and advice on contraception: Secondary | ICD-10-CM | POA: Diagnosis not present

## 2018-12-31 DIAGNOSIS — Z1388 Encounter for screening for disorder due to exposure to contaminants: Secondary | ICD-10-CM | POA: Diagnosis not present

## 2019-01-01 ENCOUNTER — Ambulatory Visit (HOSPITAL_COMMUNITY)
Admission: RE | Admit: 2019-01-01 | Discharge: 2019-01-01 | Disposition: A | Discharge status: Home / Self Care | Payer: Medicaid Other | Source: Ambulatory Visit | Attending: Obstetrics and Gynecology | Admitting: Obstetrics and Gynecology

## 2019-01-01 ENCOUNTER — Other Ambulatory Visit: Payer: Self-pay

## 2019-01-01 DIAGNOSIS — Z3A20 20 weeks gestation of pregnancy: Secondary | ICD-10-CM

## 2019-01-01 DIAGNOSIS — Z363 Encounter for antenatal screening for malformations: Secondary | ICD-10-CM

## 2019-01-01 DIAGNOSIS — Z3480 Encounter for supervision of other normal pregnancy, unspecified trimester: Secondary | ICD-10-CM | POA: Diagnosis not present

## 2019-01-02 ENCOUNTER — Ambulatory Visit (INDEPENDENT_AMBULATORY_CARE_PROVIDER_SITE_OTHER): Payer: Medicaid Other | Admitting: Certified Nurse Midwife

## 2019-01-02 DIAGNOSIS — Z3482 Encounter for supervision of other normal pregnancy, second trimester: Secondary | ICD-10-CM

## 2019-01-02 DIAGNOSIS — Z3480 Encounter for supervision of other normal pregnancy, unspecified trimester: Secondary | ICD-10-CM

## 2019-01-02 DIAGNOSIS — Z3A20 20 weeks gestation of pregnancy: Secondary | ICD-10-CM

## 2019-01-02 NOTE — Progress Notes (Signed)
Pt. Is on the phone preparing for Webex visit with the provider. [redacted]w[redacted]d. Pt was seen at [facility] and is being treated for yeast infection.

## 2019-01-02 NOTE — Progress Notes (Signed)
   TELEHEALTH VIRTUAL OBSTETRICS VISIT ENCOUNTER NOTE  I connected with Julie Potter on 12/31/18 at  2:15 PM EDT by telephone at home and verified that I am speaking with the correct person using two identifiers.   I discussed the limitations, risks, security and privacy concerns of performing an evaluation and management service by telephone and the availability of in person appointments. I also discussed with the patient that there may be a patient responsible charge related to this service. The patient expressed understanding and agreed to proceed.  Subjective:  Julie Potter is a 18 y.o. G3P0020 at [redacted]w[redacted]d being followed for ongoing prenatal care.  She is currently monitored for the following issues for this low-risk pregnancy and has CARDIAC MURMUR; Amenorrhea; Insomnia; and Supervision of other normal pregnancy, antepartum on their problem list.  Patient reports no complaints. Reports fetal movement. Denies any contractions, bleeding or leaking of fluid.   The following portions of the patient's history were reviewed and updated as appropriate: allergies, current medications, past family history, past medical history, past social history, past surgical history and problem list.   Objective:   General:  Alert, oriented and cooperative.   Mental Status: Normal mood and affect perceived. Normal judgment and thought content.  Rest of physical exam deferred due to type of encounter  Assessment and Plan:  Pregnancy: G3P0020 at [redacted]w[redacted]d 1. Supervision of other normal pregnancy, antepartum - Patient doing well, no complaints- reports being [facility] at health department for yeast infection,  health department sent medication currently taking terazol for treatment  - Routine prenatal care - Anticipatory guidance on upcoming appointments  - Educated and discussed babyscripts opt schedule and cuff should come in the mail, once receive cuff then enter BP and weight weekly into app,  patient verbalizes understanding  - Reviewed anatomy US, f/u in 4 weeks for completion of cardiac anatomy  - COVID19 precautions  - Babyscripts Schedule Optimization  Preterm labor symptoms and general obstetric precautions including but not limited to vaginal bleeding, contractions, leaking of fluid and fetal movement were reviewed in detail with the patient.  I discussed the assessment and treatment plan with the patient. The patient was provided an opportunity to ask questions and all were answered. The patient agreed with the plan and demonstrated an understanding of the instructions. The patient was advised to call back or seek an in-person office evaluation/go to MAU at [facility] for any urgent or concerning symptoms. Please refer to After Visit Summary for other counseling recommendations.   I provided 7 minutes of non-face-to-face time during this encounter.  [facility] (around [facility]30/19[facility]  Future Appointments  Date Time Provider Department Center  01/29/2019  3:15 PM WH-MFC [place] 4 WH-MFCUS M[facility]-US   Sharyon Cable, CNM [facility] for [employer], [facility]

## 2019-01-03 ENCOUNTER — Other Ambulatory Visit (HOSPITAL_COMMUNITY): Payer: Self-pay | Admitting: *Deleted

## 2019-01-03 DIAGNOSIS — Z362 Encounter for other antenatal screening follow-up: Secondary | ICD-10-CM

## 2019-01-15 ENCOUNTER — Telehealth: Payer: Self-pay | Admitting: *Deleted

## 2019-01-15 NOTE — Telephone Encounter (Signed)
Pt called to office stating she received letter stating her u/s would not be covered by [employer].  Reviewed with Verdie Mosher D.(referrals),  She states that pt may disregard letter and keep appt.  Pt made aware.

## 2019-01-24 DIAGNOSIS — Z113 Encounter for screening for infections with a predominantly sexual mode of transmission: Secondary | ICD-10-CM | POA: Diagnosis not present

## 2019-01-28 ENCOUNTER — Telehealth: Payer: Self-pay | Admitting: *Deleted

## 2019-01-28 NOTE — Telephone Encounter (Signed)
LM on VM making pt aware that her next appt is web visit.

## 2019-01-29 ENCOUNTER — Other Ambulatory Visit: Payer: Self-pay

## 2019-01-29 ENCOUNTER — Ambulatory Visit (HOSPITAL_COMMUNITY)
Admission: RE | Admit: 2019-01-29 | Discharge: 2019-01-29 | Disposition: A | Discharge status: Home / Self Care | Payer: Medicaid Other | Source: Ambulatory Visit | Attending: Maternal & Fetal Medicine | Admitting: Maternal & Fetal Medicine

## 2019-01-29 DIAGNOSIS — Z362 Encounter for other antenatal screening follow-up: Secondary | ICD-10-CM | POA: Diagnosis not present

## 2019-01-29 DIAGNOSIS — Z3A24 24 weeks gestation of pregnancy: Secondary | ICD-10-CM

## 2019-01-31 ENCOUNTER — Other Ambulatory Visit: Payer: Self-pay

## 2019-01-31 ENCOUNTER — Ambulatory Visit (INDEPENDENT_AMBULATORY_CARE_PROVIDER_SITE_OTHER): Payer: Medicaid Other

## 2019-01-31 VITALS — BP 114/60 | Wt 146.0 lb

## 2019-01-31 DIAGNOSIS — Z3A24 24 weeks gestation of pregnancy: Secondary | ICD-10-CM

## 2019-01-31 DIAGNOSIS — Z3482 Encounter for supervision of other normal pregnancy, second trimester: Secondary | ICD-10-CM

## 2019-01-31 DIAGNOSIS — Z3480 Encounter for supervision of other normal pregnancy, unspecified trimester: Secondary | ICD-10-CM

## 2019-01-31 NOTE — Progress Notes (Signed)
Pt is on the phone preparing for webex visit with provider. [redacted]w[redacted]d. Pt went to [facility] in [place] for vaginal discharge and itching, she was treated for a yeast infection.

## 2019-01-31 NOTE — Progress Notes (Signed)
   TELEHEALTH VIRTUAL OBSTETRICS VISIT ENCOUNTER NOTE  I connected with Julie Potter on 01/31/19 at  1:00 PM EDT by WebEx at home and verified that I am speaking with the correct person using two identifiers.   I discussed the limitations, risks, security and privacy concerns of performing an evaluation and management service by telephone and the availability of in person appointments. I also discussed with the patient that there may be a patient responsible charge related to this service. The patient expressed understanding and agreed to proceed.  Subjective:  Julie Potter is a 18 y.o. G3P0020 at [redacted]w[redacted]d being followed for ongoing prenatal care.  She is currently monitored for the following issues for this low-risk pregnancy and has CARDIAC MURMUR; Amenorrhea; Insomnia; and Supervision of other normal pregnancy, antepartum on their problem list.  Patient reports no complaints. Reports fetal movement. Denies any contractions, bleeding or leaking of fluid.   The following portions of the patient's history were reviewed and updated as appropriate: allergies, current medications, past family history, past medical history, past social history, past surgical history and problem list.   Objective:   General:  Alert, oriented and cooperative.   Mental Status: Normal mood and affect perceived. Normal judgment and thought content.  Rest of physical exam deferred due to type of encounter  Assessment and Plan:  Pregnancy: G3P0020 at [redacted]w[redacted]d 1. Supervision of other normal pregnancy, antepartum -BP 114/60 -Reviewed inpatient contraception options, considering Nexplanon/pp IUD -Encouraged pateint to do free virtual prenatal classes. Reviewed how to access -Anticipatory guidance of next visits including GTT and labs reviewed -Encouraged patient to call ahead if experiencing respiratory symptoms with fever so that she can be triaged and directed appropriately if testing is warranted  Preterm labor  symptoms and general obstetric precautions including but not limited to vaginal bleeding, contractions, leaking of fluid and fetal movement were reviewed in detail with the patient.  I discussed the assessment and treatment plan with the patient. The patient was provided an opportunity to ask questions and all were answered. The patient agreed with the plan and demonstrated an understanding of the instructions. The patient was advised to call back or seek an in-person office evaluation/go [facility] for any urgent or concerning symptoms. Please refer to After Visit Summary for other counseling recommendations.   I provided 15 minutes of non-face-to-face time during this encounter.  Return in about 4 weeks (around 02/28/2019) for Return OB visit, 2hr GTT and labs.  No future appointments.  Rolm Bookbinder, CNM [facility] [employer], [facility]

## 2019-02-15 DIAGNOSIS — Z20828 Contact with and (suspected) exposure to other viral communicable diseases: Secondary | ICD-10-CM | POA: Diagnosis not present

## 2019-02-28 ENCOUNTER — Ambulatory Visit (INDEPENDENT_AMBULATORY_CARE_PROVIDER_SITE_OTHER): Payer: Medicaid Other | Admitting: Certified Nurse Midwife

## 2019-02-28 ENCOUNTER — Other Ambulatory Visit: Payer: Self-pay

## 2019-02-28 ENCOUNTER — Encounter: Payer: Self-pay | Admitting: Certified Nurse Midwife

## 2019-02-28 ENCOUNTER — Other Ambulatory Visit: Payer: Medicaid Other

## 2019-02-28 VITALS — BP 113/73 | HR 78 | Wt 152.6 lb

## 2019-02-28 DIAGNOSIS — Z3480 Encounter for supervision of other normal pregnancy, unspecified trimester: Secondary | ICD-10-CM | POA: Diagnosis not present

## 2019-02-28 DIAGNOSIS — Z23 Encounter for immunization: Secondary | ICD-10-CM

## 2019-02-28 DIAGNOSIS — Z3A28 28 weeks gestation of pregnancy: Secondary | ICD-10-CM

## 2019-02-28 DIAGNOSIS — D509 Iron deficiency anemia, unspecified: Secondary | ICD-10-CM

## 2019-02-28 DIAGNOSIS — O99019 Anemia complicating pregnancy, unspecified trimester: Secondary | ICD-10-CM

## 2019-02-28 DIAGNOSIS — O99013 Anemia complicating pregnancy, third trimester: Secondary | ICD-10-CM

## 2019-02-28 NOTE — Progress Notes (Signed)
Pt is here for ROB and GTT. [redacted]w[redacted]d.

## 2019-02-28 NOTE — Progress Notes (Signed)
   PRENATAL VISIT NOTE  Subjective:  Julie Potter is a 18 y.o. G3P0020 at [redacted]w[redacted]d being seen today for ongoing prenatal care.  She is currently monitored for the following issues for this low-risk pregnancy and has [place]; Amenorrhea; Insomnia; and Supervision of other normal pregnancy, antepartum on their problem list.  Patient reports no complaints.  Contractions: Not present. Vag. Bleeding: None.  Movement: Present. Denies leaking of fluid.   The following portions of the patient's history were reviewed and updated as appropriate: allergies, current medications, past family history, past medical history, past social history, past surgical history and problem list.   Objective:   Vitals:   02/28/19 0906  BP: 113/73  Pulse: 78  Weight: 152 lb 9.6 oz (69.2 kg)    Fetal Status: Fetal Heart Rate (bpm): 154 Fundal Height: 28 cm Movement: Present     General:  Alert, oriented and cooperative. Patient is in no acute distress.  Skin: Skin is warm and dry. No rash noted.   Cardiovascular: Normal heart rate noted  Respiratory: Normal respiratory effort, no problems with respiration noted  Abdomen: Soft, gravid, appropriate for gestational age.  Pain/Pressure: Absent     Pelvic: Cervical exam deferred        Extremities: Normal range of motion.  Edema: None  Mental Status: Normal mood and affect. Normal behavior. Normal judgment and thought content.   Assessment and Plan:  Pregnancy: [id number] at [redacted]w[redacted]d 1. Supervision of other normal pregnancy, antepartum - Patient doing well, no complaints - Anticipatory guidance on upcoming appointments with next being MyChart video appointments - COVID 19 precautions given  - Routine prenatal care  - CBC - Glucose Tolerance, 2 Hours w/1 Hour - HIV Antibody (routine testing w rflx) - RPR - Tdap vaccine greater than or equal to 7yo IM  Preterm labor symptoms and general obstetric precautions including but not limited to vaginal bleeding,  contractions, leaking of fluid and fetal movement were reviewed in detail with the patient. Please refer to After Visit Summary for other counseling recommendations.   Return in about 4 weeks (around 03/28/2019) for [place]- mychart visit .  Future Appointments  Date Time Provider [place]  03/28/2019  4:15 PM Sloan Leiter, MD [place] None    Lajean Manes, CNM

## 2019-02-28 NOTE — Patient Instructions (Signed)
Glucose Tolerance Test During Pregnancy Why am I having this test? The glucose tolerance test (GTT) is done to check how your body processes sugar (glucose). This is one of several tests used to diagnose diabetes that develops during pregnancy (gestational diabetes mellitus). Gestational diabetes is a temporary form of diabetes that some women develop during pregnancy. It usually occurs during the second trimester of pregnancy and goes away after delivery. Testing (screening) for gestational diabetes usually occurs between 24 and 28 weeks of pregnancy. You may have the GTT test after having a 1-hour glucose screening test if the results from that test indicate that you may have gestational diabetes. You may also have this test if:  You have a history of gestational diabetes.  You have a history of giving birth to very large babies or have experienced repeated fetal loss (stillbirth).  You have signs and symptoms of diabetes, such as: ? Changes in your vision. ? Tingling or numbness in your hands or feet. ? Changes in hunger, thirst, and urination that are not otherwise explained by your pregnancy. What is being tested? This test measures the amount of glucose in your blood at different times during a period of 3 hours. This indicates how well your body is able to process glucose. What kind of sample is taken?  Blood samples are required for this test. They are usually collected by inserting a needle into a blood vessel. How do I prepare for this test?  For 3 days before your test, eat normally. Have plenty of carbohydrate-rich foods.  Follow instructions from your health care provider about: ? Eating or drinking restrictions on the day of the test. You may be asked to not eat or drink anything other than water (fast) starting 8-10 hours before the test. ? Changing or stopping your regular medicines. Some medicines may interfere with this test. Tell a health care provider about:  All  medicines you are taking, including vitamins, herbs, eye drops, creams, and over-the-counter medicines.  Any blood disorders you have.  Any surgeries you have had.  Any medical conditions you have. What happens during the test? First, your blood glucose will be measured. This is referred to as your fasting blood glucose, since you fasted before the test. Then, you will drink a glucose solution that contains a certain amount of glucose. Your blood glucose will be measured again 1, 2, and 3 hours after drinking the solution. This test takes about 3 hours to complete. You will need to stay at the testing location during this time. During the testing period:  Do not eat or drink anything other than the glucose solution.  Do not exercise.  Do not use any products that contain nicotine or tobacco, such as cigarettes and e-cigarettes. If you need help stopping, ask your health care provider. The testing procedure may vary among health care providers and hospitals. How are the results reported? Your results will be reported as milligrams of glucose per deciliter of blood (mg/dL) or millimoles per liter (mmol/L). Your health care provider will compare your results to normal ranges that were established after testing a large group of people (reference ranges). Reference ranges may vary among labs and hospitals. For this test, common reference ranges are:  Fasting: less than 95-105 mg/dL (5.3-5.8 mmol/L).  1 hour after drinking glucose: less than 180-190 mg/dL (10.0-10.5 mmol/L).  2 hours after drinking glucose: less than 155-165 mg/dL (8.6-9.2 mmol/L).  3 hours after drinking glucose: 140-145 mg/dL (7.8-8.1 mmol/L). What do the   results mean? Results within reference ranges are considered normal, meaning that your glucose levels are well-controlled. If two or more of your blood glucose levels are high, you may be diagnosed with gestational diabetes. If only one level is high, your health care  provider may suggest repeat testing or other tests to confirm a diagnosis. Talk with your health care provider about what your results mean. Questions to ask your health care provider Ask your health care provider, or the department that is doing the test:  When will my results be ready?  How will I get my results?  What are my treatment options?  What other tests do I need?  What are my next steps? Summary  The glucose tolerance test (GTT) is one of several tests used to diagnose diabetes that develops during pregnancy (gestational diabetes mellitus). Gestational diabetes is a temporary form of diabetes that some women develop during pregnancy.  You may have the GTT test after having a 1-hour glucose screening test if the results from that test indicate that you may have gestational diabetes. You may also have this test if you have any symptoms or risk factors for gestational diabetes.  Talk with your health care provider about what your results mean. This information is not intended to replace advice given to you by your health care provider. Make sure you discuss any questions you have with your health care provider. Document Released: 02/28/2012 Document Revised: 04/10/2017 Document Reviewed: 04/10/2017 Elsevier Interactive Patient Education  2019 Elsevier Inc.  

## 2019-03-01 LAB — CBC
Hematocrit: 27.9 % — ABNORMAL LOW (ref 34.0–46.6)
Hemoglobin: 9.5 g/dL — ABNORMAL LOW (ref 11.1–15.9)
MCH: 30.1 pg (ref 26.6–33.0)
MCHC: 34.1 g/dL (ref 31.5–35.7)
MCV: 88 fL (ref 79–97)
Platelets: 308 10*3/uL (ref 150–450)
RBC: 3.16 x10E6/uL — ABNORMAL LOW (ref 3.77–5.28)
RDW: 11.7 % (ref 11.7–15.4)
WBC: 8 10*3/uL (ref 3.4–10.8)

## 2019-03-01 LAB — HIV ANTIBODY (ROUTINE TESTING W REFLEX): HIV Screen 4th Generation wRfx: NONREACTIVE

## 2019-03-01 LAB — GLUCOSE TOLERANCE, 2 HOURS W/ 1HR
Glucose, 1 hour: 90 mg/dL (ref 65–179)
Glucose, 2 hour: 101 mg/dL (ref 65–152)
Glucose, Fasting: 72 mg/dL (ref 65–91)

## 2019-03-01 LAB — RPR: RPR Ser Ql: NONREACTIVE

## 2019-03-04 ENCOUNTER — Encounter: Payer: Self-pay | Admitting: Certified Nurse Midwife

## 2019-03-04 ENCOUNTER — Telehealth: Payer: Self-pay

## 2019-03-04 DIAGNOSIS — D509 Iron deficiency anemia, unspecified: Secondary | ICD-10-CM | POA: Insufficient documentation

## 2019-03-04 MED ORDER — FERROUS SULFATE 325 (65 FE) MG PO TABS: 325.0000 mg | ORAL_TABLET | Freq: Two times a day (BID) | ORAL | 1 refills | Status: DC

## 2019-03-04 NOTE — Addendum Note (Signed)
Addended by: Lajean Manes on: 03/04/2019 08:32 AM   Modules accepted: Orders

## 2019-03-04 NOTE — Telephone Encounter (Signed)
  Left VM message to pick up Rx at Pharmacy.

## 2019-03-04 NOTE — Telephone Encounter (Signed)
-----   Message from Lajean Manes, CNM sent at 03/04/2019  8:32 AM EDT ----- Mychart message sent to patient, Rx for iron supplementation sent to pharmacy on file   Lajean Manes, CNM 03/04/19, 8:31 AM

## 2019-03-28 ENCOUNTER — Telehealth (INDEPENDENT_AMBULATORY_CARE_PROVIDER_SITE_OTHER): Payer: Medicaid Other | Admitting: Obstetrics and Gynecology

## 2019-03-28 ENCOUNTER — Encounter: Payer: Self-pay | Admitting: Obstetrics and Gynecology

## 2019-03-28 VITALS — BP 123/58 | HR 71

## 2019-03-28 DIAGNOSIS — D509 Iron deficiency anemia, unspecified: Secondary | ICD-10-CM

## 2019-03-28 DIAGNOSIS — Z3A32 32 weeks gestation of pregnancy: Secondary | ICD-10-CM

## 2019-03-28 DIAGNOSIS — Z3480 Encounter for supervision of other normal pregnancy, unspecified trimester: Secondary | ICD-10-CM

## 2019-03-28 DIAGNOSIS — O99013 Anemia complicating pregnancy, third trimester: Secondary | ICD-10-CM

## 2019-03-28 NOTE — Progress Notes (Signed)
S/w pt to prepare for mychart visit. Pt reports fetal movement, complains of occasional back pain.

## 2019-03-28 NOTE — Progress Notes (Addendum)
   TELEHEALTH OBSTETRICS PRENATAL VIRTUAL VIDEO VISIT ENCOUNTER NOTE  Provider location: [facility] for [place] at [place]   I connected with Augustina Mood on 03/28/19 at  4:15 PM EDT by WebEx Video Encounter at home and verified that I am speaking with the correct person using two identifiers.   I discussed the limitations, risks, security and privacy concerns of performing an evaluation and management service virtually and the availability of in person appointments. I also discussed with the patient that there may be a patient responsible charge related to this service. The patient expressed understanding and agreed to proceed. Subjective:  Julie Potter is a 18 y.o. G3P0020 at [redacted]w[redacted]d being seen today for ongoing prenatal care.  She is currently monitored for the following issues for this low-risk pregnancy and has [place]; Amenorrhea; Insomnia; Supervision of other normal pregnancy, antepartum; and Iron deficiency anemia during pregnancy on their problem list.  Patient reports backache.  Contractions: Not present. Vag. Bleeding: None.  Movement: Present. Denies any leaking of fluid.   The following portions of the patient's history were reviewed and updated as appropriate: allergies, current medications, past family history, past medical history, past social history, past surgical history and problem list.   Objective:   Vitals:   03/28/19 1416  BP: (!) 123/58  Pulse: 71   Fetal Status:     Movement: Present     General:  Alert, oriented and cooperative. Patient is in no acute distress.  Respiratory: Normal respiratory effort, no problems with respiration noted  Mental Status: Normal mood and affect. Normal behavior. Normal judgment and thought content.  Rest of physical exam deferred due to type of encounter  Imaging: No results found.  Assessment and Plan:  Pregnancy: [id number] at [redacted]w[redacted]d  1. Supervision of other normal pregnancy, antepartum Taking iron  supplement - reviewed LARCS, sent info for contraception  Preterm labor symptoms and general obstetric precautions including but not limited to vaginal bleeding, contractions, leaking of fluid and fetal movement were reviewed in detail with the patient. I discussed the assessment and treatment plan with the patient. The patient was provided an opportunity to ask questions and all were answered. The patient agreed with the plan and demonstrated an understanding of the instructions. The patient was advised to call back or seek an in-person office evaluation/go to MAU at [facility] for any urgent or concerning symptoms. Please refer to After Visit Summary for other counseling recommendations.   I provided 15 minutes of face-to-face time during this encounter.  Return in about 2 weeks (around 04/11/2019) for OB visit, virtual.  Future Appointments  Date Time Provider [place]  03/28/2019  4:15 PM Sloan Leiter, MD [place] None    Sloan Leiter, [place] for [facility], [place]

## 2019-04-07 ENCOUNTER — Inpatient Hospital Stay (HOSPITAL_COMMUNITY)
Admission: AD | Admit: 2019-04-07 | Discharge: 2019-04-07 | Disposition: A | Discharge status: Home / Self Care | Payer: Medicaid Other | Attending: Obstetrics & Gynecology | Admitting: Obstetrics & Gynecology

## 2019-04-07 ENCOUNTER — Other Ambulatory Visit: Payer: Self-pay

## 2019-04-07 DIAGNOSIS — K625 Hemorrhage of anus and rectum: Secondary | ICD-10-CM | POA: Diagnosis not present

## 2019-04-07 DIAGNOSIS — Z7722 Contact with and (suspected) exposure to environmental tobacco smoke (acute) (chronic): Secondary | ICD-10-CM | POA: Diagnosis not present

## 2019-04-07 DIAGNOSIS — O468X3 Other antepartum hemorrhage, third trimester: Secondary | ICD-10-CM

## 2019-04-07 DIAGNOSIS — Z3A34 34 weeks gestation of pregnancy: Secondary | ICD-10-CM | POA: Insufficient documentation

## 2019-04-07 DIAGNOSIS — O26893 Other specified pregnancy related conditions, third trimester: Secondary | ICD-10-CM | POA: Diagnosis not present

## 2019-04-07 LAB — URINALYSIS, ROUTINE W REFLEX MICROSCOPIC
Bilirubin Urine: NEGATIVE
Glucose, UA: NEGATIVE mg/dL
Ketones, ur: NEGATIVE mg/dL
Leukocytes,Ua: NEGATIVE
Nitrite: NEGATIVE
Protein, ur: NEGATIVE mg/dL
Specific Gravity, Urine: 1.017 (ref 1.005–1.030)
pH: 7 (ref 5.0–8.0)

## 2019-04-07 NOTE — MAU Provider Note (Signed)
History     CSN: [id number]  Arrival date and time: 04/07/19 1339   First Provider Initiated Contact with Patient 04/07/19 1529      Chief Complaint  Patient presents with  . rectal bleeding with bm   18 y.o. G3P0020 @34 .2 wks presenting with rectal bleeding. Sx started 3 days ago. Bleeding is red and with BMs only. Sees blood in the toilet and in stool. Denies pain. Denies straining, hard stools, constipation, or diarrhea. No pregnancy complaints. Denies VB, LOF, ctx. +FM. She is certain the bleeding is rectal.    OB History    Gravida  3   Para      Term      Preterm      AB  2   Living  0     SAB      TAB  2   Ectopic      Multiple      Live Births              Past Medical History:  Diagnosis Date  . Heart murmur     Past Surgical History:  Procedure Laterality Date  . NO PAST SURGERIES      Family History  Problem Relation Age of Onset  . Hyperlipidemia Mother   . Hyperlipidemia Maternal Grandmother   . COPD Maternal Grandmother   . Heart disease Maternal Grandmother   . Diabetes Paternal Grandfather     Social History   Tobacco Use  . Smoking status: Passive Smoke Exposure - Never Smoker  . Smokeless tobacco: Never Used  Substance Use Topics  . Alcohol use: No  . Drug use: No    Allergies: No Known Allergies  Medications Prior to Admission  Medication Sig Dispense Refill Last Dose  . ferrous sulfate (FERROUSUL) 325 (65 FE) MG tablet Take 1 tablet (325 mg total) by mouth 2 (two) times daily. 60 tablet 1   . Prenatal Multivit-Min-Fe-FA (PRENATAL VITAMINS) 0.8 MG tablet Take 1 tablet by mouth daily. 30 tablet 12     Review of Systems  Constitutional: Negative for chills and fever.  Gastrointestinal: Positive for anal bleeding. Negative for abdominal pain, constipation, diarrhea and rectal pain.  Genitourinary: Negative for vaginal bleeding and vaginal discharge.   Physical Exam   Blood pressure (!) 111/49, pulse 92,  temperature 98.2 F (36.8 C), temperature source Oral, resp. rate 16, last menstrual period 07/29/2018, SpO2 99 %.  Physical Exam  Nursing note and vitals reviewed. Constitutional: She is oriented to person, place, and time. She appears well-developed and well-nourished. No distress.  HENT:  Head: Normocephalic and atraumatic.  Neck: Normal range of motion.  Cardiovascular: Normal rate.  Respiratory: Effort normal. No respiratory distress.  Genitourinary:    Rectum normal.  Rectum:     No rectal mass, anal fissure, tenderness, external hemorrhoid, internal hemorrhoid or abnormal anal tone.   Musculoskeletal: Normal range of motion.  Neurological: She is alert and oriented to person, place, and time.  Skin: Skin is warm and dry.  Psychiatric: She has a normal mood and affect.  FHT 140  Results for orders placed or performed during the hospital encounter of 04/07/19 (from the past 24 hour(s))  Urinalysis, Routine w reflex microscopic     Status: Abnormal   Collection Time: 04/07/19  2:00 PM  Result Value Ref Range   Color, Urine YELLOW YELLOW   APPearance CLEAR CLEAR   Specific Gravity, Urine 1.017 1.005 - 1.030   pH 7.0 5.0 -  8.0   Glucose, UA NEGATIVE NEGATIVE mg/dL   Hgb urine dipstick LARGE (A) NEGATIVE   Bilirubin Urine NEGATIVE NEGATIVE   Ketones, ur NEGATIVE NEGATIVE mg/dL   Protein, ur NEGATIVE NEGATIVE mg/dL   Nitrite NEGATIVE NEGATIVE   Leukocytes,Ua NEGATIVE NEGATIVE   RBC / HPF 11-20 0 - 5 RBC/hpf   WBC, UA 0-5 0 - 5 WBC/hpf   Bacteria, UA RARE (A) NONE SEEN   Squamous Epithelial / LPF 0-5 0 - 5   Mucus PRESENT    MAU Course  Procedures  MDM Suspect internal hemorrhoid, unable to palpate. Recommend increased dietary fiber and fluids. Notify MD if persists. Stable for discharge home.   Assessment and Plan   1. [redacted] weeks gestation of pregnancy   2. Rectal bleeding    Discharge home Follow up at [facility] as scheduled Notify MD for persistent sx  Allergies  as of 04/07/2019   No Known Allergies     Medication List    TAKE these medications   ferrous sulfate 325 (65 FE) MG tablet Commonly known as: FerrouSul Take 1 tablet (325 mg total) by mouth 2 (two) times daily.   Prenatal Vitamins 0.8 MG tablet Take 1 tablet by mouth daily.      Julianne Handler, CNM 04/07/2019, 3:47 PM

## 2019-04-07 NOTE — MAU Note (Signed)
Pt reports bleeding "from rectum" only when having bowel movements for the past several days. Pt denies any straining during BM. Pt reports good fetal movement, denies any pain at this time and denies LOF.

## 2019-04-07 NOTE — Discharge Instructions (Signed)
Rectal Bleeding ° °Rectal bleeding is when blood comes out of the opening of the butt (anus). People with this kind of bleeding may notice bright red blood in their underwear or in the toilet after they poop (have a bowel movement). They may also have dark red or black poop (stool). Rectal bleeding is often a sign that something is wrong. It needs to be checked by a doctor. °Follow these instructions at home: °Watch for any changes in your condition. Take these actions to help with bleeding and discomfort: °· Eat a diet that is high in fiber. This will keep your poop soft so it is easier for you to poop without pushing too hard. Ask your doctor to tell you what foods and drinks are high in fiber. °· Drink enough fluid to keep your pee (urine) clear or pale yellow. This also helps keep your poop soft. °· Try taking a warm bath. This may help with pain. °· Keep all follow-up visits as told by your doctor. This is important. °Get help right away if: °· You have new bleeding. °· You have more bleeding than before. °· You have black or dark red poop. °· You throw up (vomit) blood or something that looks like coffee grounds. °· You have pain or tenderness in your belly (abdomen). °· You have a fever. °· You feel weak. °· You feel sick to your stomach (nauseous). °· You pass out (faint). °· You have very bad pain in your butt. °· You cannot poop. °This information is not intended to replace advice given to you by your health care provider. Make sure you discuss any questions you have with your health care provider. °Document Released: 05/11/2011 Document Revised: 08/11/2017 Document Reviewed: 10/25/2015 °Elsevier Patient Education © 2020 Else[employer] Inc. ° °

## 2019-04-11 ENCOUNTER — Encounter: Payer: Self-pay | Admitting: Obstetrics

## 2019-04-11 ENCOUNTER — Telehealth (INDEPENDENT_AMBULATORY_CARE_PROVIDER_SITE_OTHER): Payer: Medicaid Other | Admitting: Obstetrics

## 2019-04-11 VITALS — BP 110/70 | Wt 163.0 lb

## 2019-04-11 DIAGNOSIS — M549 Dorsalgia, unspecified: Secondary | ICD-10-CM

## 2019-04-11 DIAGNOSIS — O99013 Anemia complicating pregnancy, third trimester: Secondary | ICD-10-CM

## 2019-04-11 DIAGNOSIS — Z3A34 34 weeks gestation of pregnancy: Secondary | ICD-10-CM

## 2019-04-11 DIAGNOSIS — D509 Iron deficiency anemia, unspecified: Secondary | ICD-10-CM

## 2019-04-11 DIAGNOSIS — Z3480 Encounter for supervision of other normal pregnancy, unspecified trimester: Secondary | ICD-10-CM

## 2019-04-11 DIAGNOSIS — Z3483 Encounter for supervision of other normal pregnancy, third trimester: Secondary | ICD-10-CM

## 2019-04-11 MED ORDER — COMFORT FIT MATERNITY SUPP SM MISC: 0 refills | Status: DC

## 2019-04-11 NOTE — Progress Notes (Signed)
   TELEHEALTH OBSTETRICS PRENATAL VIRTUAL VIDEO VISIT ENCOUNTER NOTE  Provider location: [facility] for [place] at [place]   I connected with Augustina Mood on 04/11/19 at 11:15 AM EDT by WebEx OB MyChart Video Encounter at home and verified that I am speaking with the correct person using two identifiers.   I discussed the limitations, risks, security and privacy concerns of performing an evaluation and management service virtually and the availability of in person appointments. I also discussed with the patient that there may be a patient responsible charge related to this service. The patient expressed understanding and agreed to proceed. Subjective:  Julie Potter is a 18 y.o. G3P0020 at [redacted]w[redacted]d being seen today for ongoing prenatal care.  She is currently monitored for the following issues for this low-risk pregnancy and has [place]; Amenorrhea; Insomnia; Supervision of other normal pregnancy, antepartum; and Iron deficiency anemia during pregnancy on their problem list.  Patient reports backache.  Contractions: Irritability. Vag. Bleeding: None.  Movement: Present. Denies any leaking of fluid.   The following portions of the patient's history were reviewed and updated as appropriate: allergies, current medications, past family history, past medical history, past social history, past surgical history and problem list.   Objective:   Vitals:   04/11/19 1116  BP: 110/70  Weight: 163 lb (73.9 kg)    Fetal Status:     Movement: Present     General:  Alert, oriented and cooperative. Patient is in no acute distress.  Respiratory: Normal respiratory effort, no problems with respiration noted  Mental Status: Normal mood and affect. Normal behavior. Normal judgment and thought content.  Rest of physical exam deferred due to type of encounter  Imaging: No results found.  Assessment and Plan:  Pregnancy: [id number] at [redacted]w[redacted]d 1. Iron deficiency anemia during pregnancy  2.  Backache symptom Rx: - Elastic Bandages & Supports (COMFORT FIT MATERNITY SUPP SM) MISC; Wear as directed.  Dispense: 1 each; Refill: 0  3. Supervision of other normal pregnancy, antepartum   Preterm labor symptoms and general obstetric precautions including but not limited to vaginal bleeding, contractions, leaking of fluid and fetal movement were reviewed in detail with the patient. I discussed the assessment and treatment plan with the patient. The patient was provided an opportunity to ask questions and all were answered. The patient agreed with the plan and demonstrated an understanding of the instructions. The patient was advised to call back or seek an in-person office evaluation/go to [facility]U at [facility] for any urgent or concerning symptoms. Please refer to After Visit Summary for other counseling recommendations.   I provided 10 minutes of face-to-face time during this encounter.  No follow-ups on file.  No future appointments.  Baltazar Najjar, MD Center for [facility], [place] [facility] 04-11-2019

## 2019-04-11 NOTE — Progress Notes (Signed)
Pt is on the phone preparing for virtual visit with provider. 101w6d.

## 2019-04-25 ENCOUNTER — Ambulatory Visit (INDEPENDENT_AMBULATORY_CARE_PROVIDER_SITE_OTHER): Payer: Medicaid Other | Admitting: Obstetrics & Gynecology

## 2019-04-25 ENCOUNTER — Other Ambulatory Visit (HOSPITAL_COMMUNITY)
Admission: RE | Admit: 2019-04-25 | Discharge: 2019-04-25 | Disposition: A | Discharge status: Home / Self Care | Payer: Medicaid Other | Source: Ambulatory Visit | Attending: Obstetrics & Gynecology | Admitting: Obstetrics & Gynecology

## 2019-04-25 ENCOUNTER — Other Ambulatory Visit: Payer: Self-pay

## 2019-04-25 VITALS — BP 116/67 | HR 67 | Wt 167.7 lb

## 2019-04-25 DIAGNOSIS — Z3480 Encounter for supervision of other normal pregnancy, unspecified trimester: Secondary | ICD-10-CM

## 2019-04-25 DIAGNOSIS — Z3A36 36 weeks gestation of pregnancy: Secondary | ICD-10-CM

## 2019-04-25 DIAGNOSIS — O99019 Anemia complicating pregnancy, unspecified trimester: Secondary | ICD-10-CM | POA: Diagnosis not present

## 2019-04-25 DIAGNOSIS — D509 Iron deficiency anemia, unspecified: Secondary | ICD-10-CM

## 2019-04-25 NOTE — Addendum Note (Signed)
Addended byCleotilde Neer on: 04/25/2019 04:41 PM   Modules accepted: Orders

## 2019-04-25 NOTE — Patient Instructions (Signed)

## 2019-04-25 NOTE — Progress Notes (Signed)
   PRENATAL VISIT NOTE  Subjective:  Julie Potter is a 18 y.o. G3P0020 at [redacted]w[redacted]d being seen today for ongoing prenatal care.  She is currently monitored for the following issues for this low-risk pregnancy and has CARDIAC MURMUR; Insomnia; Supervision of other normal pregnancy, antepartum; and Iron deficiency anemia during pregnancy on their problem list.  Patient reports occasional contractions.  Contractions: Irritability. Vag. Bleeding: None.  Movement: Present. Denies leaking of fluid.   The following portions of the patient's history were reviewed and updated as appropriate: allergies, current medications, past family history, past medical history, past social history, past surgical history and problem list.   Objective:   Vitals:   04/25/19 1604  BP: 116/67  Pulse: 67  Weight: 167 lb 11.2 oz (76.1 kg)    Fetal Status: Fetal Heart Rate (bpm): 145 Fundal Height: 37 cm Movement: Present  Presentation: Vertex  General:  Alert, oriented and cooperative. Patient is in no acute distress.  Skin: Skin is warm and dry. No rash noted.   Cardiovascular: Normal heart rate noted  Respiratory: Normal respiratory effort, no problems with respiration noted  Abdomen: Soft, gravid, appropriate for gestational age.  Pain/Pressure: Absent     Pelvic: Cervical exam performed Dilation: Closed Effacement (%): 30 Station: Ballotable  Extremities: Normal range of motion.  Edema: None  Mental Status: Normal mood and affect. Normal behavior. Normal judgment and thought content.   Assessment and Plan:  Pregnancy: [id number] at [redacted]w[redacted]d 1. Supervision of other normal pregnancy, antepartum ANEMIA - CBC  2. Iron deficiency anemia during pregnancy  - CBC  Term labor symptoms and general obstetric precautions including but not limited to vaginal bleeding, contractions, leaking of fluid and fetal movement were reviewed in detail with the patient. Please refer to After Visit Summary for other counseling  recommendations.   Return in about 1 week (around 05/02/2019) for virtual.  No future appointments.  Emeterio Reeve, MD

## 2019-04-25 NOTE — Progress Notes (Signed)
Pt is here for ROB, [redacted]w[redacted]d.  

## 2019-04-26 LAB — CBC
Hematocrit: 34.9 % (ref 34.0–46.6)
Hemoglobin: 11.8 g/dL (ref 11.1–15.9)
MCH: 29.6 pg (ref 26.6–33.0)
MCHC: 33.8 g/dL (ref 31.5–35.7)
MCV: 88 fL (ref 79–97)
Platelets: 279 10*3/uL (ref 150–450)
RBC: 3.98 x10E6/uL (ref 3.77–5.28)
RDW: 14.7 % (ref 11.7–15.4)
WBC: 8.5 10*3/uL (ref 3.4–10.8)

## 2019-04-27 LAB — CERVICOVAGINAL ANCILLARY ONLY
Chlamydia: NEGATIVE
Neisseria Gonorrhea: NEGATIVE

## 2019-04-27 LAB — STREP GP B NAA: Strep Gp B NAA: NEGATIVE

## 2019-05-02 ENCOUNTER — Encounter: Payer: Self-pay | Admitting: Obstetrics

## 2019-05-02 ENCOUNTER — Telehealth (INDEPENDENT_AMBULATORY_CARE_PROVIDER_SITE_OTHER): Payer: Medicaid Other | Admitting: Obstetrics

## 2019-05-02 VITALS — BP 110/70

## 2019-05-02 DIAGNOSIS — Z3A37 37 weeks gestation of pregnancy: Secondary | ICD-10-CM

## 2019-05-02 DIAGNOSIS — Z3480 Encounter for supervision of other normal pregnancy, unspecified trimester: Secondary | ICD-10-CM

## 2019-05-02 DIAGNOSIS — O99013 Anemia complicating pregnancy, third trimester: Secondary | ICD-10-CM

## 2019-05-02 DIAGNOSIS — D509 Iron deficiency anemia, unspecified: Secondary | ICD-10-CM

## 2019-05-02 NOTE — Progress Notes (Signed)
   TELEHEALTH OBSTETRICS PRENATAL VIRTUAL VIDEO VISIT ENCOUNTER NOTE  Provider location: [facility] for [place] [facility] [place]   I connected with Julie Potter on 05/02/19 at 11:00 AM EDT by WebEx OB MyChart Video Encounter at home and verified that I am speaking with the correct person using two identifiers.   I discussed the limitations, risks, security and privacy concerns of performing an evaluation and management service virtually and the availability of in person appointments. I also discussed with the patient that there may be a patient responsible charge related to this service. The patient expressed understanding and agreed to proceed. Subjective:  Julie Potter is a 18 y.o. G3P0020 at [redacted]w[redacted]d being seen today for ongoing prenatal care.  She is currently monitored for the following issues for this low-risk pregnancy and has CARDIAC MURMUR; Insomnia; Supervision of other normal pregnancy, antepartum; and Iron deficiency anemia during pregnancy on their problem list.  Patient reports backache.  Contractions: Not present. Vag. Bleeding: None.  Movement: Present. Denies any leaking of fluid.   The following portions of the patient's history were reviewed and updated as appropriate: allergies, current medications, past family history, past medical history, past social history, past surgical history and problem list.   Objective:   Vitals:   05/02/19 1058  BP: 110/70    Fetal Status:     Movement: Present     General:  Alert, oriented and cooperative. Patient is in no acute distress.  Respiratory: Normal respiratory effort, no problems with respiration noted  Mental Status: Normal Potter and affect. Normal behavior. Normal judgment and thought content.  Rest of physical exam deferred due to type of encounter  Imaging: No results found.  Assessment and Plan:  Pregnancy: [id number] at [redacted]w[redacted]d 1. Supervision of other normal pregnancy, antepartum  2. Iron deficiency anemia during  pregnancy - taking iron, improved   Term labor symptoms and general obstetric precautions including but not limited to vaginal bleeding, contractions, leaking of fluid and fetal movement were reviewed in detail with the patient. I discussed the assessment and treatment plan with the patient. The patient was provided an opportunity to ask questions and all were answered. The patient agreed with the plan and demonstrated an understanding of the instructions. The patient was advised to call back or seek an in-person office evaluation/go to MAU at [facility] for any urgent or concerning symptoms. Please refer to After Visit Summary for other counseling recommendations.   I provided 10 minutes of face-to-face time during this encounter.  Return in about 1 week (around 05/09/2019) for MyChart.    Baltazar Najjar, MD Center for [facility], [place] [facility] 05/02/2019

## 2019-05-04 ENCOUNTER — Other Ambulatory Visit: Payer: Self-pay | Admitting: Certified Nurse Midwife

## 2019-05-04 DIAGNOSIS — D509 Iron deficiency anemia, unspecified: Secondary | ICD-10-CM

## 2019-05-09 ENCOUNTER — Encounter: Payer: Self-pay | Admitting: Obstetrics

## 2019-05-09 ENCOUNTER — Telehealth (INDEPENDENT_AMBULATORY_CARE_PROVIDER_SITE_OTHER): Payer: Medicaid Other | Admitting: Obstetrics

## 2019-05-09 VITALS — BP 122/63 | HR 58 | Wt 165.0 lb

## 2019-05-09 DIAGNOSIS — O99013 Anemia complicating pregnancy, third trimester: Secondary | ICD-10-CM

## 2019-05-09 DIAGNOSIS — D509 Iron deficiency anemia, unspecified: Secondary | ICD-10-CM

## 2019-05-09 DIAGNOSIS — Z3A38 38 weeks gestation of pregnancy: Secondary | ICD-10-CM

## 2019-05-09 DIAGNOSIS — O99019 Anemia complicating pregnancy, unspecified trimester: Secondary | ICD-10-CM

## 2019-05-09 DIAGNOSIS — Z3480 Encounter for supervision of other normal pregnancy, unspecified trimester: Secondary | ICD-10-CM

## 2019-05-09 NOTE — Progress Notes (Signed)
   TELEHEALTH OBSTETRICS PRENATAL VIRTUAL VIDEO VISIT ENCOUNTER NOTE  Provider location: [facility] for [place] at [place]   I connected with Julie Potter on 05/09/19 at 11:00 AM EDT by WebEx OB MyChart Video Encounter at home and verified that I am speaking with the correct person using two identifiers.   I discussed the limitations, risks, security and privacy concerns of performing an evaluation and management service virtually and the availability of in person appointments. I also discussed with the patient that there may be a patient responsible charge related to this service. The patient expressed understanding and agreed to proceed. Subjective:  Julie Potter is a 18 y.o. G3P0020 at [redacted]w[redacted]d being seen today for ongoing prenatal care.  She is currently monitored for the following issues for this low-risk pregnancy and has CARDIAC MURMUR; Insomnia; Supervision of other normal pregnancy, antepartum; and Iron deficiency anemia during pregnancy on their problem list.  Patient reports no complaints.  Contractions: Not present. Vag. Bleeding: None.  Movement: Present. Denies any leaking of fluid.   The following portions of the patient's history were reviewed and updated as appropriate: allergies, current medications, past family history, past medical history, past social history, past surgical history and problem list.   Objective:   Vitals:   05/09/19 1104  BP: 122/63  Pulse: (!) 58  Weight: 165 lb (74.8 kg)    Fetal Status:     Movement: Present     General:  Alert, oriented and cooperative. Patient is in no acute distress.  Respiratory: Normal respiratory effort, no problems with respiration noted  Mental Status: Normal Potter and affect. Normal behavior. Normal judgment and thought content.  Rest of physical exam deferred due to type of encounter  Imaging: No results found.  Assessment and Plan:  Pregnancy: [id number] at [redacted]w[redacted]d 1. Supervision of other normal pregnancy,  antepartu  2. Iron deficiency anemia during pregnancy   Term labor symptoms and general obstetric precautions including but not limited to vaginal bleeding, contractions, leaking of fluid and fetal movement were reviewed in detail with the patient. I discussed the assessment and treatment plan with the patient. The patient was provided an opportunity to ask questions and all were answered. The patient agreed with the plan and demonstrated an understanding of the instructions. The patient was advised to call back or seek an in-person office evaluation/go to MAU at [facility] for any urgent or concerning symptoms. Please refer to After Visit Summary for other counseling recommendations.   I provided 10 minutes of face-to-face time during this encounter.  Return in about 1 week (around 05/16/2019) for ROB in person.   Baltazar Najjar, MD Center for [facility], [place] [facility] 05/09/2019

## 2019-05-09 NOTE — Progress Notes (Signed)
I connected with Julie Potter on 05/09/19 at 11:00 AM EDT by telephone and verified that I am speaking with the correct person using two identifiers.   My Chart ROB: No concerns today per pt

## 2019-05-12 ENCOUNTER — Inpatient Hospital Stay (HOSPITAL_COMMUNITY): Payer: Medicaid Other | Admitting: Anesthesiology

## 2019-05-12 ENCOUNTER — Encounter (HOSPITAL_COMMUNITY): Payer: Self-pay

## 2019-05-12 ENCOUNTER — Other Ambulatory Visit: Payer: Self-pay

## 2019-05-12 ENCOUNTER — Inpatient Hospital Stay (HOSPITAL_COMMUNITY)
Admission: AD | Admit: 2019-05-12 | Discharge: 2019-05-14 | DRG: 807 | Disposition: A | Discharge status: Home / Self Care | Payer: Medicaid Other | Attending: Obstetrics & Gynecology | Admitting: Obstetrics & Gynecology

## 2019-05-12 DIAGNOSIS — R011 Cardiac murmur, unspecified: Secondary | ICD-10-CM | POA: Diagnosis present

## 2019-05-12 DIAGNOSIS — O9902 Anemia complicating childbirth: Secondary | ICD-10-CM | POA: Diagnosis present

## 2019-05-12 DIAGNOSIS — Z7722 Contact with and (suspected) exposure to environmental tobacco smoke (acute) (chronic): Secondary | ICD-10-CM | POA: Diagnosis present

## 2019-05-12 DIAGNOSIS — Z20828 Contact with and (suspected) exposure to other viral communicable diseases: Secondary | ICD-10-CM | POA: Diagnosis present

## 2019-05-12 DIAGNOSIS — O26893 Other specified pregnancy related conditions, third trimester: Secondary | ICD-10-CM | POA: Diagnosis present

## 2019-05-12 DIAGNOSIS — Z3A39 39 weeks gestation of pregnancy: Secondary | ICD-10-CM

## 2019-05-12 DIAGNOSIS — D509 Iron deficiency anemia, unspecified: Secondary | ICD-10-CM | POA: Diagnosis present

## 2019-05-12 HISTORY — DX: Anemia, unspecified: D64.9

## 2019-05-12 LAB — CBC
HCT: 38.3 % (ref 36.0–46.0)
Hemoglobin: 13 g/dL (ref 12.0–15.0)
MCH: 30.8 pg (ref 26.0–34.0)
MCHC: 33.9 g/dL (ref 30.0–36.0)
MCV: 90.8 fL (ref 80.0–100.0)
Platelets: 243 10*3/uL (ref 150–400)
RBC: 4.22 MIL/uL (ref 3.87–5.11)
RDW: 14.4 % (ref 11.5–15.5)
WBC: 10.1 10*3/uL (ref 4.0–10.5)
nRBC: 0 % (ref 0.0–0.2)

## 2019-05-12 LAB — TYPE AND SCREEN
ABO/RH(D): A POS
Antibody Screen: NEGATIVE

## 2019-05-12 LAB — RPR: RPR Ser Ql: NONREACTIVE

## 2019-05-12 LAB — ABO/RH: ABO/RH(D): A POS

## 2019-05-12 LAB — SARS CORONAVIRUS 2 BY RT PCR (HOSPITAL ORDER, PERFORMED IN ~~LOC~~ HOSPITAL LAB): SARS Coronavirus 2: NEGATIVE

## 2019-05-12 MED ORDER — FENTANYL CITRATE (PF) 100 MCG/2ML IJ SOLN: 100.0000 ug | INTRAMUSCULAR | Status: DC | PRN

## 2019-05-12 MED ORDER — OXYTOCIN 40 UNITS IN NORMAL SALINE INFUSION - SIMPLE MED
2.5000 [IU]/h | INTRAVENOUS | Status: DC
  Filled 2019-05-12: qty 1000

## 2019-05-12 MED ORDER — PHENYLEPHRINE 40 MCG/ML (10ML) SYRINGE FOR IV PUSH (FOR BLOOD PRESSURE SUPPORT): 80.0000 ug | PREFILLED_SYRINGE | INTRAVENOUS | Status: DC | PRN

## 2019-05-12 MED ORDER — OXYCODONE-ACETAMINOPHEN 5-325 MG PO TABS: 1.0000 | ORAL_TABLET | ORAL | Status: DC | PRN

## 2019-05-12 MED ORDER — SIMETHICONE 80 MG PO CHEW: 80.0000 mg | CHEWABLE_TABLET | ORAL | Status: DC | PRN

## 2019-05-12 MED ORDER — LACTATED RINGERS IV SOLN
500.0000 mL | INTRAVENOUS | Status: DC | PRN
  Administered 2019-05-12: 500 mL via INTRAVENOUS

## 2019-05-12 MED ORDER — ZOLPIDEM TARTRATE 5 MG PO TABS: 5.0000 mg | ORAL_TABLET | Freq: Every evening | ORAL | Status: DC | PRN

## 2019-05-12 MED ORDER — LACTATED RINGERS IV SOLN
INTRAVENOUS | Status: DC
  Administered 2019-05-12 (×3): via INTRAVENOUS

## 2019-05-12 MED ORDER — DIPHENHYDRAMINE HCL 25 MG PO CAPS: 25.0000 mg | ORAL_CAPSULE | Freq: Four times a day (QID) | ORAL | Status: DC | PRN

## 2019-05-12 MED ORDER — SENNOSIDES-DOCUSATE SODIUM 8.6-50 MG PO TABS
2.0000 | ORAL_TABLET | ORAL | Status: DC
  Administered 2019-05-12 – 2019-05-13 (×2): 2 via ORAL
  Filled 2019-05-12 (×2): qty 2

## 2019-05-12 MED ORDER — LACTATED RINGERS IV SOLN: 500.0000 mL | Freq: Once | INTRAVENOUS | Status: DC

## 2019-05-12 MED ORDER — LIDOCAINE HCL (PF) 1 % IJ SOLN
INTRAMUSCULAR | Status: DC | PRN
  Administered 2019-05-12 (×2): 5 mL via EPIDURAL

## 2019-05-12 MED ORDER — WITCH HAZEL-GLYCERIN EX PADS: 1.0000 "application " | MEDICATED_PAD | CUTANEOUS | Status: DC | PRN

## 2019-05-12 MED ORDER — FENTANYL-BUPIVACAINE-NACL 0.5-0.125-0.9 MG/250ML-% EP SOLN
EPIDURAL | Status: AC
  Filled 2019-05-12: qty 250

## 2019-05-12 MED ORDER — DIBUCAINE (PERIANAL) 1 % EX OINT: 1.0000 "application " | TOPICAL_OINTMENT | CUTANEOUS | Status: DC | PRN

## 2019-05-12 MED ORDER — FLEET ENEMA 7-19 GM/118ML RE ENEM: 1.0000 | ENEMA | RECTAL | Status: DC | PRN

## 2019-05-12 MED ORDER — BENZOCAINE-MENTHOL 20-0.5 % EX AERO
1.0000 "application " | INHALATION_SPRAY | CUTANEOUS | Status: DC | PRN
  Administered 2019-05-12: 1 via TOPICAL
  Filled 2019-05-12: qty 56

## 2019-05-12 MED ORDER — COCONUT OIL OIL: 1.0000 "application " | TOPICAL_OIL | Status: DC | PRN

## 2019-05-12 MED ORDER — FENTANYL-BUPIVACAINE-NACL 0.5-0.125-0.9 MG/250ML-% EP SOLN: 12.0000 mL/h | EPIDURAL | Status: DC | PRN

## 2019-05-12 MED ORDER — TETANUS-DIPHTH-ACELL PERTUSSIS 5-2.5-18.5 LF-MCG/0.5 IM SUSP: 0.5000 mL | Freq: Once | INTRAMUSCULAR | Status: DC

## 2019-05-12 MED ORDER — IBUPROFEN 600 MG PO TABS
600.0000 mg | ORAL_TABLET | Freq: Four times a day (QID) | ORAL | Status: DC
  Administered 2019-05-12 – 2019-05-14 (×7): 600 mg via ORAL
  Filled 2019-05-12 (×7): qty 1

## 2019-05-12 MED ORDER — ONDANSETRON HCL 4 MG/2ML IJ SOLN: 4.0000 mg | Freq: Four times a day (QID) | INTRAMUSCULAR | Status: DC | PRN

## 2019-05-12 MED ORDER — PRENATAL MULTIVITAMIN CH
1.0000 | ORAL_TABLET | Freq: Every day | ORAL | Status: DC
  Administered 2019-05-13 – 2019-05-14 (×2): 1 via ORAL
  Filled 2019-05-12 (×2): qty 1

## 2019-05-12 MED ORDER — LIDOCAINE HCL (PF) 1 % IJ SOLN: 30.0000 mL | INTRAMUSCULAR | Status: DC | PRN

## 2019-05-12 MED ORDER — EPHEDRINE 5 MG/ML INJ: 10.0000 mg | INTRAVENOUS | Status: DC | PRN

## 2019-05-12 MED ORDER — SODIUM CHLORIDE (PF) 0.9 % IJ SOLN
INTRAMUSCULAR | Status: DC | PRN
  Administered 2019-05-12: 12 mL/h via EPIDURAL

## 2019-05-12 MED ORDER — SOD CITRATE-CITRIC ACID 500-334 MG/5ML PO SOLN: 30.0000 mL | ORAL | Status: DC | PRN

## 2019-05-12 MED ORDER — ONDANSETRON HCL 4 MG/2ML IJ SOLN: 4.0000 mg | INTRAMUSCULAR | Status: DC | PRN

## 2019-05-12 MED ORDER — ACETAMINOPHEN 325 MG PO TABS: 650.0000 mg | ORAL_TABLET | ORAL | Status: DC | PRN

## 2019-05-12 MED ORDER — ONDANSETRON HCL 4 MG PO TABS: 4.0000 mg | ORAL_TABLET | ORAL | Status: DC | PRN

## 2019-05-12 MED ORDER — OXYCODONE-ACETAMINOPHEN 5-325 MG PO TABS: 2.0000 | ORAL_TABLET | ORAL | Status: DC | PRN

## 2019-05-12 MED ORDER — OXYTOCIN BOLUS FROM INFUSION
500.0000 mL | Freq: Once | INTRAVENOUS | Status: AC
  Administered 2019-05-12: 500 mL via INTRAVENOUS

## 2019-05-12 MED ORDER — DIPHENHYDRAMINE HCL 50 MG/ML IJ SOLN: 12.5000 mg | INTRAMUSCULAR | Status: DC | PRN

## 2019-05-12 NOTE — Progress Notes (Signed)
Labor Progress Note Julie Potter is a 18 y.o. G3P0020 at [redacted]w[redacted]d presented for SOL.  S: Epidural in place. Pain well controlled at this time. No new symptoms.  O:  BP (!) 126/59   Pulse 67   Temp 97.8 F (36.6 C) (Oral)   Resp 16   Ht 5\' 4"  (1.626 m)   Wt 75.7 kg   LMP 07/29/2018 (Exact Date)   SpO2 99%   BMI 28.65 kg/m  EFM: 130/moderate var/accels, early decels  CVE: Dilation: 7 Effacement (%): 80, 90 Cervical Position: Middle Station: 0 Presentation: Vertex Exam by:: Dr Pilar Plate   A&P: 18 y.o. Z3P8251 [redacted]w[redacted]d here for SOL. #Labor: Progressing without augmentation.  Minimal change since last cervical exam. AROM at 1630 with clear fluid. #Pain: epidural in place #FWB: category I #GBS negative  [name], MD 4:41 PM

## 2019-05-12 NOTE — Anesthesia Procedure Notes (Signed)
Epidural Patient location during procedure: OB  Staffing Anesthesiologist: Marcelis Wissner, MD Performed: anesthesiologist   Preanesthetic Checklist Completed: patient identified, site marked, surgical consent, pre-op evaluation, timeout performed, IV checked, risks and benefits discussed and monitors and equipment checked  Epidural Patient position: sitting Prep: DuraPrep Patient monitoring: heart rate, continuous pulse ox and blood pressure Approach: right paramedian Location: L3-L4 Injection technique: LOR saline  Needle:  Needle type: Tuohy  Needle gauge: 17 G Needle length: 9 cm and 9 Needle insertion depth: 6 cm Catheter type: closed end flexible Catheter size: 20 Guage Catheter at skin depth: 10 cm Test dose: negative  Assessment Events: blood not aspirated, injection not painful, no injection resistance, negative IV test and no paresthesia  Additional Notes Patient identified. Risks/Benefits/Options discussed with patient including but not limited to bleeding, infection, nerve damage, paralysis, failed block, incomplete pain control, headache, blood pressure changes, nausea, vomiting, reactions to medication both or allergic, itching and postpartum back pain. Confirmed with bedside nurse the patient's most recent platelet count. Confirmed with patient that they are not currently taking any anticoagulation, have any bleeding history or any family history of bleeding disorders. Patient expressed understanding and wished to proceed. All questions were answered. Sterile technique was used throughout the entire procedure. Please see nursing notes for vital signs. Test dose was given through epidural needle and negative prior to continuing to dose epidural or start infusion. Warning signs of high block given to the patient including shortness of breath, tingling/numbness in hands, complete motor block, or any concerning symptoms with instructions to call for help. Patient was given  instructions on fall risk and not to get out of bed. All questions and concerns addressed with instructions to call with any issues.     

## 2019-05-12 NOTE — Discharge Summary (Signed)
Postpartum Discharge Summary     Patient Name: Julie Potter DOB: 12-24-2000 MRN: [id number]  Date of admission: 05/12/2019 Delivering Provider: [name]   Date of discharge: 05/14/2019  Admitting diagnosis: 57 wks ctx Intrauterine pregnancy: [redacted]w[redacted]d    Secondary diagnosis:  Active Problems:   CARDIAC MURMUR   Iron deficiency anemia during pregnancy   Normal labor   SVD (spontaneous vaginal delivery)  Additional problems: None     Discharge diagnosis: Term Pregnancy Delivered                                                                                                Post partum procedures:None  Augmentation: AROM  Complications: None  Hospital course:  Onset of Labor With Vaginal Delivery     18y.o. yo G3P0020 at 341w2das admitted in Active Labor on 05/12/2019. Patient had an uncomplicated labor course as follows:  Membrane Rupture Time/Date: 4:30 PM ,05/12/2019   Intrapartum Procedures: Episiotomy: None [1]                                         Lacerations:  2nd degree [3];Perineal [11]  Patient had a delivery of a Viable infant. 05/12/2019  Information for the patient's newborn:  VaJaelin, Devincentis0[[id number]]Delivery Method: Vaginal, Spontaneous(Filed from Delivery Summary)  Delivery time: 6:45 PM   Pateint had an uncomplicated postpartum course.  She is ambulating, tolerating a regular diet, passing flatus, and urinating well. Patient is discharged home in stable condition on 05/14/19.    Magnesium Sulfate received: No BMZ received: No Rhophylac:N/A MMR:No Transfusion:No  Physical exam  Vitals:   05/13/19 0326 05/13/19 0740 05/13/19 2100 05/14/19 0515  BP: (!) 115/53 107/67 118/70 109/69  Pulse: (!) 59 (!) 54 (!) 55 (!) 50  Resp: _0 Temp: 98.3 F (36.8 C) 98.1 F (36.7 C) 98.1 F (36.7 C) 97.9 F (36.6 C)  TempSrc: Oral Axillary Oral Oral  SpO2: 100%  100% 100%  Weight:      Height:       General: alert Lochia:  appropriate Uterine Fundus: firm Incision: N/A DVT Evaluation: No evidence of DVT seen on physical exam. Labs: Lab Results  Component Value Date   WBC 13.3 (H) 05/13/2019   HGB 10.7 (L) 05/13/2019   HCT 31.9 (L) 05/13/2019   MCV 90.9 05/13/2019   PLT 219 05/13/2019   CMP Latest Ref Rng & Units 09/21/2018  Glucose 70 - 99 mg/dL 86  BUN 6 - 20 mg/dL 7  Creatinine 0.44 - 1.00 mg/dL 0.50  Sodium 135 - 145 mmol/L 133(L)  Potassium 3.5 - 5.1 mmol/L 3.8  Chloride 98 - 111 mmol/L 104  CO2 22 - 32 mmol/L 21(L)  Calcium 8.9 - 10.3 mg/dL 8.8(L)    Discharge instruction: per After Visit Summary and "Baby and Me Booklet".  After visit meds:  Allergies as of 05/14/2019   No Known Allergies     Medication List  TAKE these medications   [place] as directed.   ferrous sulfate 325 (65 FE) MG tablet Commonly known as: FerrouSul Take 1 tablet (325 mg total) by mouth 2 (two) times daily.   ibuprofen 600 MG tablet Commonly known as: ADVIL Take 1 tablet (600 mg total) by mouth every 6 (six) hours.   Prenatal Vitamins 0.8 MG tablet Take 1 tablet by mouth daily.   senna-docusate 8.6-50 MG tablet Commonly known as: Senokot-S Take 2 tablets by mouth daily. Start taking on: May 15, 2019       Diet: routine diet  Activity: Advance as tolerated. Pelvic rest for 6 weeks.   Outpatient follow up:4 weeks Follow up Appt: Future Appointments  Date Time Provider [place]  06/10/2019  1:15 PM [place]-Kirby, Kathie Dike, CNM [facility] None   Follow up Visit:   Please schedule this patient for Postpartum visit in: 4 weeks with the following provider: Any provider For C/S patients schedule nurse incision check in weeks 2 weeks: NA Low risk pregnancy complicated by: Nothing Delivery mode:  SVD Anticipated Birth Control:  Depo (given prior to d/c)  PP Procedures needed: None  Schedule Integrated BH visit: no      Newborn Data: Live born  female  Birth Weight: 7 lb 6.9 oz (3371 g) APGAR: 8, 9  Newborn Delivery   Birth date/time: 05/12/2019 18:45:00 Delivery type: Vaginal, Spontaneous      Baby Feeding: Bottle and Breast Disposition:home with mother   05/14/2019 Melina Schools, DO

## 2019-05-12 NOTE — Progress Notes (Addendum)
Julie Potter is a 18 y.o. G3P0020 at [redacted]w[redacted]d.  Subjective: Comfortable w/ epidural.   Objective: BP (!) 121/57   Pulse (!) 59   Temp 98.7 F (37.1 C) (Oral)   Resp 16   Ht 5\' 4"  (1.626 m)   Wt 75.7 kg   LMP 07/29/2018 (Exact Date)   SpO2 99%   BMI 28.65 kg/m    FHT:  FHR: 135 bpm, variability: mod,  accelerations:  15x15,  decelerations:  none UC:   Q 1-5 minutes, moderate Dilation: 7 Effacement (%): 80 Cervical Position: Middle Station: -1 Presentation: Vertex Exam by:: Odin Aibhlinn Kalmar,CNM     Labs: Results for orders placed or performed during the hospital encounter of 05/12/19 (from the past 24 hour(s))  CBC     Status: None   Collection Time: 05/12/19  8:20 AM  Result Value Ref Range   WBC 10.1 4.0 - 10.5 K/uL   RBC 4.22 3.87 - 5.11 MIL/uL   Hemoglobin 13.0 12.0 - 15.0 g/dL   HCT 38.3 36.0 - 46.0 %   MCV 90.8 80.0 - 100.0 fL   MCH 30.8 26.0 - 34.0 pg   MCHC 33.9 30.0 - 36.0 g/dL   RDW 14.4 11.5 - 15.5 %   Platelets 243 150 - 400 K/uL   nRBC 0.0 0.0 - 0.2 %  Type and screen [place]     Status: None   Collection Time: 05/12/19  8:20 AM  Result Value Ref Range   ABO/RH(D) A POS    Antibody Screen NEG    Sample Expiration      05/15/2019,2359 Performed at [facility], [place] [address], [place], [place] [address]   RPR     Status: None   Collection Time: 05/12/19  8:20 AM  Result Value Ref Range   RPR Ser Ql NON REACTIVE NON REACTIVE  ABO/Rh     Status: None   Collection Time: 05/12/19  8:20 AM  Result Value Ref Range   ABO/RH(D)      A POS Performed at [employer] [address], [place], [place] [address]   SARS Coronavirus 2 [facility] order, Performed in [facility] hospital lab) Nasopharyngeal Nasopharyngeal Swab     Status: None   Collection Time: 05/12/19  9:16 AM   Specimen: Nasopharyngeal Swab  Result Value Ref Range   SARS Coronavirus 2 NEGATIVE NEGATIVE    Assessment / Plan: [redacted]w[redacted]d week IUP Labor:  Active Fetal Wellbeing:  Category I Pain Control:  Epidural Anticipated MOD:  SVD  Tamala Julian [place], [place] 05/12/2019 1:16 PM

## 2019-05-12 NOTE — MAU Note (Signed)
Pt here with contractions since yesterday, every 20 mins. No LOF. Lost mucous plug that had some blood in it. Reports good fetal movement. Cervix closed on last exam.

## 2019-05-12 NOTE — Progress Notes (Signed)
Patient up to bathroom using Stedy device.  Unable to void.  While cleaning patient on toilet, she stated "I don't feel good" and got pale. She was seated, but lost conciousness.  Steadied on toilet with 2 RNs at her side.  Extra help called for and patient came around after a few seconds.  Cool cloth to forehead and when pt was feeling better got back to bed with Stedy and 2 RNs.  Blood loss stable while on toilet.  Given juice and crackers.  Feeling better, but will remain in L&D a bit longer to make sure stable.  Mother/Baby RN made aware.

## 2019-05-12 NOTE — H&P (Addendum)
OBSTETRIC ADMISSION HISTORY AND PHYSICAL  Julie Potter is a 18 y.o. female G58P0020 with IUP at [redacted]w[redacted]d by LMP presenting for SOL at home. She reports +FMs, No LOF, no VB, no blurry vision, headaches or peripheral edema, and RUQ pain.  She plans on breast and bottle feeding. She request Depo for birth control. She received her prenatal care at [place]: By LMP --->  Estimated Date of Delivery: 05/17/19  Sono:  @[redacted]w[redacted]d , CWD, normal anatomy, cephalic presentation, 109N, 45% EFW   Prenatal History/Complications: Anemia Trichomonas infection w/ a TOC on 11/08/18  Past Medical History: Past Medical History:  Diagnosis Date  . Anemia   . Heart murmur     Past Surgical History: Past Surgical History:  Procedure Laterality Date  . NO PAST SURGERIES      Obstetrical History: OB History    Gravida  3   Para      Term      Preterm      AB  2   Living  0     SAB      TAB  2   Ectopic      Multiple      Live Births              Social History: Social History   Socioeconomic History  . Marital status: Single    Spouse name: Not on file  . Number of children: Not on file  . Years of education: Not on file  . Highest education level: Not on file  Occupational History  . Not on file  Social Needs  . Financial resource strain: Not on file  . Food insecurity    Worry: Not on file    Inability: Not on file  . Transportation needs    Medical: Not on file    Non-medical: Not on file  Tobacco Use  . Smoking status: Passive Smoke Exposure - Never Smoker  . Smokeless tobacco: Never Used  Substance and Sexual Activity  . Alcohol use: No  . Drug use: No  . Sexual activity: Not Currently    Birth control/protection: None  Lifestyle  . Physical activity    Days per week: Not on file    Minutes per session: Not on file  . Stress: Not on file  Relationships  . Social Herbalist on phone: Not on file    Gets together: Not on file    Attends  religious service: Not on file    Active member of club or organization: Not on file    Attends meetings of clubs or organizations: Not on file    Relationship status: Not on file  Other Topics Concern  . Not on file  Social History Narrative  . Not on file    Family History: Family History  Problem Relation Age of Onset  . Hyperlipidemia Mother   . Hyperlipidemia Maternal Grandmother   . COPD Maternal Grandmother   . Heart disease Maternal Grandmother   . Diabetes Paternal Grandfather     Allergies: No Known Allergies  Medications Prior to Admission  Medication Sig Dispense Refill Last Dose  . Elastic Bandages & Supports (COMFORT FIT MATERNITY SUPP SM) MISC Wear as directed. 1 each 0   . ferrous sulfate (FERROUSUL) 325 (65 FE) MG tablet Take 1 tablet (325 mg total) by mouth 2 (two) times daily. 60 tablet 1   . Prenatal Multivit-Min-Fe-FA (PRENATAL VITAMINS) 0.8 MG tablet Take 1 tablet  by mouth daily. 30 tablet 12      Review of Systems   All systems reviewed and negative except as stated in HPI  Blood pressure 139/69, pulse 74, temperature 99 F (37.2 C), temperature source Oral, resp. rate 16, height 5\' 4"  (1.626 m), weight 75.7 kg, last menstrual period 07/29/2018, SpO2 99 %. General appearance: alert, cooperative and appears stated age Lungs: clear to auscultation bilaterally Heart: regular rate and rhythm Abdomen: soft, non-tender; bowel sounds normal Pelvic: normal mucosa, no evidence of lesions.  Presentation: cephalic Fetal monitoringBaseline: 140 bpm, Variability: Good {> 6 bpm) and Accelerations: Reactive Uterine activityFrequency: Every 2-5 minutes Dilation: 5 Effacement (%): 80 Station: -3 Exam by:: christina robinson rn   Prenatal labs: ABO, Rh: --/--/PENDING (08/30 0820) Antibody: PENDING (08/30 0820) Rubella: 2.80 (03/19 1554) RPR: Non Reactive (06/18 0931)  HBsAg: Negative (03/19 1554)  HIV: Non Reactive (06/18 0931)  GBS: Negative (08/13  0448)  2 hr Glucola neg Genetic screening  Low risk Anatomy US normal  Prenatal Transfer Tool  Maternal Diabetes: No Genetic Screening: Normal Maternal Ultrasounds/Referrals: Normal Fetal Ultrasounds or other Referrals:  None Maternal Substance Abuse:  No Significant Maternal Medications:  None Significant Maternal Lab Results: Group B Strep negative  Results for orders placed or performed during the hospital encounter of 05/12/19 (from the past 24 hour(s))  CBC   Collection Time: 05/12/19  8:20 AM  Result Value Ref Range   WBC 10.1 4.0 - 10.5 K/uL   RBC 4.22 3.87 - 5.11 MIL/uL   Hemoglobin 13.0 12.0 - 15.0 g/dL   HCT 16.138.3 09.636.0 - 04.546.0 %   MCV 90.8 80.0 - 100.0 fL   MCH 30.8 26.0 - 34.0 pg   MCHC 33.9 30.0 - 36.0 g/dL   RDW 40.914.4 81.111.5 - 91.415.5 %   Platelets 243 150 - 400 K/uL   nRBC 0.0 0.0 - 0.2 %  Type and screen [facility]   Collection Time: 05/12/19  8:20 AM  Result Value Ref Range   ABO/RH(D) PENDING    Antibody Screen PENDING    Sample Expiration      05/15/2019,2359 Performed at [facility], [address] [place], [place] [address]     Patient Active Problem List   Diagnosis Date Noted  . Normal labor 05/12/2019  . Iron deficiency anemia during pregnancy 03/04/2019  . Supervision of other normal pregnancy, antepartum 11/29/2018  . Insomnia 02/21/2018  . CARDIAC MURMUR 11/29/2007    Assessment/Plan:  Julie Potter is a 18 y.o. G3P0020 at 502w2d here for SOL.  #Labor:progressing without augmentation.  Continue to manage expectantly. #Pain: IV pain meds for now. Epidural upon request. #FWB: Category I #ID:  GBS neg #MOF: Breast #MOC:Depo #Circ:  N/A #Hx of Anemia: Hgb 13.0 today.  Mirian MoPeter Frank, MD  05/12/2019, 8:54 AM  I was present for the exam and agree with above.  [place], [place], CNM 05/12/2019 2:12 PM

## 2019-05-12 NOTE — Anesthesia Preprocedure Evaluation (Signed)
Anesthesia Evaluation  Patient identified by MRN, date of birth, ID band Patient awake    Reviewed: Allergy & Precautions, H&P , NPO status , Patient's Chart, lab work & pertinent test results, reviewed documented beta blocker date and time   History of Anesthesia Complications Negative for: history of anesthetic complications  Airway Mallampati: II  TM Distance: >3 FB Neck ROM: full    Dental no notable dental hx. (+) Teeth Intact   Pulmonary neg pulmonary ROS,    Pulmonary exam normal breath sounds clear to auscultation       Cardiovascular Exercise Tolerance: Good negative cardio ROS Normal cardiovascular exam Rhythm:regular Rate:Normal     Neuro/Psych negative neurological ROS  negative psych ROS   GI/Hepatic negative GI ROS, Neg liver ROS,   Endo/Other  negative endocrine ROS  Renal/GU negative Renal ROS  negative genitourinary   Musculoskeletal   Abdominal   Peds  Hematology negative hematology ROS (+)   Anesthesia Other Findings   Reproductive/Obstetrics (+) Pregnancy                             Anesthesia Physical Anesthesia Plan  ASA: II  Anesthesia Plan: Epidural   Post-op Pain Management:    Induction:   PONV Risk Score and Plan: Treatment may vary due to age or medical condition  Airway Management Planned:   Additional Equipment:   Intra-op Plan:   Post-operative Plan:   Informed Consent: I have reviewed the patients History and Physical, chart, labs and discussed the procedure including the risks, benefits and alternatives for the proposed anesthesia with the patient or authorized representative who has indicated his/her understanding and acceptance.     Dental Advisory Given  Plan Discussed with: CRNA  Anesthesia Plan Comments:         Anesthesia Quick Evaluation

## 2019-05-13 LAB — CBC
HCT: 31.9 % — ABNORMAL LOW (ref 36.0–46.0)
Hemoglobin: 10.7 g/dL — ABNORMAL LOW (ref 12.0–15.0)
MCH: 30.5 pg (ref 26.0–34.0)
MCHC: 33.5 g/dL (ref 30.0–36.0)
MCV: 90.9 fL (ref 80.0–100.0)
Platelets: 219 10*3/uL (ref 150–400)
RBC: 3.51 MIL/uL — ABNORMAL LOW (ref 3.87–5.11)
RDW: 14.6 % (ref 11.5–15.5)
WBC: 13.3 10*3/uL — ABNORMAL HIGH (ref 4.0–10.5)
nRBC: 0 % (ref 0.0–0.2)

## 2019-05-13 NOTE — Anesthesia Postprocedure Evaluation (Signed)
Anesthesia Post Note  Patient: Julie Potter  Procedure(s) Performed: AN AD HOC LABOR EPIDURAL     Patient location during evaluation: Mother Baby Anesthesia Type: Epidural Level of consciousness: awake and alert Pain management: pain level controlled Vital Signs Assessment: post-procedure vital signs reviewed and stable Respiratory status: spontaneous breathing, nonlabored ventilation and respiratory function stable Cardiovascular status: stable Postop Assessment: no headache, no backache and epidural receding Anesthetic complications: no    Last Vitals:  Vitals:   05/12/19 2308 05/13/19 0326  BP: 118/60 (!) 115/53  Pulse: 65 (!) 59  Resp: 16 15  Temp: 36.7 C 36.8 C  SpO2: 100% 100%    Last Pain:  Vitals:   05/13/19 0534  TempSrc:   PainSc: 0-No pain   Pain Goal:                   Drucie Opitz

## 2019-05-13 NOTE — Progress Notes (Signed)
Post Partum Day 1 Subjective: no complaints, up ad lib, voiding and tolerating PO  Reports feeling tired but that she has rested some and attempted to breastfeed. Minimal lochia.   Objective: Blood pressure (!) 115/53, pulse (!) 59, temperature 98.3 F (36.8 C), temperature source Oral, resp. rate 15, height 5\' 4"  (1.626 m), weight 75.7 kg, last menstrual period 07/29/2018, SpO2 100 %, unknown if currently breastfeeding.  Physical Exam:  General: alert, cooperative, appears stated age and no distress Lochia: appropriate Uterine Fundus: firm DVT Evaluation: No evidence of DVT seen on physical exam.  Recent Labs    05/12/19 0820  HGB 13.0  HCT 38.3    Assessment/Plan: Plan for discharge tomorrow Breastfeeding: Lactation to assist as able Contraception: Depo prior to discharge Rubella Immune; s/p Tdap CBC ordered by delivering provider for later today due to hx of anemia (antepartum Hgb normal at 13.0) EBL 277 mL    LOS: 1 day   Julie Potter Julie Potter 05/13/2019, 3:51 AM

## 2019-05-14 MED ORDER — MEDROXYPROGESTERONE ACETATE 150 MG/ML IM SUSP
150.0000 mg | Freq: Once | INTRAMUSCULAR | Status: AC
  Administered 2019-05-14: 150 mg via INTRAMUSCULAR
  Filled 2019-05-14: qty 1

## 2019-05-14 MED ORDER — IBUPROFEN 600 MG PO TABS: 600.0000 mg | ORAL_TABLET | Freq: Four times a day (QID) | ORAL | 1 refills | Status: DC

## 2019-05-14 MED ORDER — SENNOSIDES-DOCUSATE SODIUM 8.6-50 MG PO TABS: 2.0000 | ORAL_TABLET | ORAL | 0 refills | Status: DC

## 2019-05-14 NOTE — Progress Notes (Signed)
POSTPARTUM PROGRESS NOTE  Post Partum Day 2  Subjective:  Julie Potter is a 19 y.o. [id number] s/p SVD at [redacted]w[redacted]d.  She reports she is doing well. No acute events overnight. She denies any problems with ambulating, voiding or po intake. Denies nausea or vomiting.  Pain is well controlled.  Lochia is appropriate.  Objective: Blood pressure 109/69, pulse (!) 50, temperature 97.9 F (36.6 C), temperature source Oral, resp. rate 18, height 5\' 4"  (1.626 m), weight 75.7 kg, last menstrual period 07/29/2018, SpO2 100 %, unknown if currently breastfeeding.  Physical Exam:  General: alert, cooperative and no distress Chest: no respiratory distress. CTAB Heart:regular rate and rhythm, distal pulses intact Abdomen: soft, nontender, bowel sounds soft but present  Uterine Fundus: firm, appropriately tender DVT Evaluation: No calf swelling or tenderness Extremities: no edema Skin: warm, dry  Recent Labs    05/12/19 0820 05/13/19 0530  HGB 13.0 10.7*  HCT 38.3 31.9*    Assessment/Plan: Julie Potter is a 18 y.o. [id number] s/p SVD at [redacted]w[redacted]d   PPD#2 - Doing well  Routine postpartum care Contraception: depo Feeding: breast and bottle Dispo: Plan for discharge today, 9/1.   LOS: 2 days   [place] Student 05/14/2019, 7:11 AM

## 2019-05-14 NOTE — Discharge Instructions (Signed)

## 2019-06-10 ENCOUNTER — Ambulatory Visit (INDEPENDENT_AMBULATORY_CARE_PROVIDER_SITE_OTHER): Payer: Medicaid Other | Admitting: Advanced Practice Midwife

## 2019-06-10 ENCOUNTER — Other Ambulatory Visit: Payer: Self-pay

## 2019-06-10 ENCOUNTER — Encounter: Payer: Self-pay | Admitting: Obstetrics

## 2019-06-10 DIAGNOSIS — Z3042 Encounter for surveillance of injectable contraceptive: Secondary | ICD-10-CM | POA: Insufficient documentation

## 2019-06-10 DIAGNOSIS — Z1389 Encounter for screening for other disorder: Secondary | ICD-10-CM | POA: Diagnosis not present

## 2019-06-10 MED ORDER — MEDROXYPROGESTERONE ACETATE 150 MG/ML IM SUSP: 150.0000 mg | INTRAMUSCULAR | 0 refills | Status: DC

## 2019-06-10 NOTE — Progress Notes (Signed)
Post Partum Exam  Julie Potter is a 18 y.o. G32P1021 female who presents for a postpartum visit. She is 4 weeks postpartum following a spontaneous vaginal delivery. I have fully reviewed the prenatal and intrapartum course. The delivery was at 43 gestational weeks.  Anesthesia: epidural. Postpartum course has been doing well. Baby's course has been doing well. Baby is feeding by bottle - Gerber Gentle. Bleeding moderate lochia. Bowel function is normal. Bladder function is normal. Patient is not sexually active. Contraception method is Depo-Provera injections.  Postpartum depression screening:neg,score 0.  The following portions of the patient's history were reviewed and updated as appropriate: allergies, current medications, past family history, past medical history, past social history, past surgical history and problem list.   Review of Systems Pertinent items noted in HPI and remainder of comprehensive ROS otherwise negative.    Objective:  Last menstrual period 07/29/2018, unknown if currently breastfeeding.  VS reviewed, nursing note reviewed,  Constitutional: well developed, well nourished, no distress HEENT: normocephalic CV: normal rate Pulm/chest wall: normal effort Abdomen: soft Neuro: alert and oriented x 3 Skin: warm, dry Psych: affect normal      Assessment/Plan:    1. Postpartum care following vaginal delivery --Doing well, good support at home.  2. Depo-Provera contraceptive status --First dose given in the hospital --Discussed LARCs as alternatives to Depo if pt desires to change in the future.  Rx for Depo and appt in 3 months for next injection.    Social/emotional concerns:  none.  Contraception: Depo-Provera injections  Follow up in: 3 months or as needed.   Fatima Blank, CNM 1:34 PM

## 2019-06-10 NOTE — Patient Instructions (Signed)

## 2019-07-09 ENCOUNTER — Telehealth: Payer: Self-pay

## 2019-07-09 NOTE — Telephone Encounter (Signed)
Returned call, no answer, left vm 

## 2019-07-11 ENCOUNTER — Telehealth: Payer: Self-pay

## 2019-07-11 NOTE — Telephone Encounter (Signed)
Returned call and answered questions about depo and bleeding after delivery.

## 2019-07-11 NOTE — Telephone Encounter (Signed)
Returned call, and answered questions about depo and bleeding after delivery.

## 2019-07-25 DIAGNOSIS — Z20828 Contact with and (suspected) exposure to other viral communicable diseases: Secondary | ICD-10-CM | POA: Diagnosis not present

## 2019-09-10 ENCOUNTER — Ambulatory Visit: Payer: Medicaid Other

## 2019-09-23 ENCOUNTER — Ambulatory Visit: Payer: Medicaid Other

## 2019-09-26 ENCOUNTER — Encounter (HOSPITAL_COMMUNITY): Payer: Self-pay | Admitting: Obstetrics and Gynecology

## 2019-11-20 ENCOUNTER — Other Ambulatory Visit: Payer: Self-pay

## 2019-11-20 ENCOUNTER — Encounter: Payer: Self-pay | Admitting: Emergency Medicine

## 2019-11-20 DIAGNOSIS — A5901 Trichomonal vulvovaginitis: Secondary | ICD-10-CM | POA: Insufficient documentation

## 2019-11-20 DIAGNOSIS — Z7722 Contact with and (suspected) exposure to environmental tobacco smoke (acute) (chronic): Secondary | ICD-10-CM | POA: Insufficient documentation

## 2019-11-20 DIAGNOSIS — Z79899 Other long term (current) drug therapy: Secondary | ICD-10-CM | POA: Diagnosis not present

## 2019-11-20 DIAGNOSIS — N939 Abnormal uterine and vaginal bleeding, unspecified: Secondary | ICD-10-CM | POA: Diagnosis not present

## 2019-11-20 DIAGNOSIS — R102 Pelvic and perineal pain: Secondary | ICD-10-CM | POA: Diagnosis not present

## 2019-11-20 DIAGNOSIS — A599 Trichomoniasis, unspecified: Secondary | ICD-10-CM | POA: Diagnosis not present

## 2019-11-20 DIAGNOSIS — N72 Inflammatory disease of cervix uteri: Secondary | ICD-10-CM | POA: Diagnosis not present

## 2019-11-20 DIAGNOSIS — R109 Unspecified abdominal pain: Secondary | ICD-10-CM | POA: Diagnosis present

## 2019-11-20 LAB — CBC WITH DIFFERENTIAL/PLATELET
Abs Immature Granulocytes: 0.01 10*3/uL (ref 0.00–0.07)
Basophils Absolute: 0.1 10*3/uL (ref 0.0–0.1)
Basophils Relative: 1 %
Eosinophils Absolute: 0.1 10*3/uL (ref 0.0–0.5)
Eosinophils Relative: 2 %
HCT: 35 % — ABNORMAL LOW (ref 36.0–46.0)
Hemoglobin: 11.4 g/dL — ABNORMAL LOW (ref 12.0–15.0)
Immature Granulocytes: 0 %
Lymphocytes Relative: 42 %
Lymphs Abs: 3.2 10*3/uL (ref 0.7–4.0)
MCH: 28.5 pg (ref 26.0–34.0)
MCHC: 32.6 g/dL (ref 30.0–36.0)
MCV: 87.5 fL (ref 80.0–100.0)
Monocytes Absolute: 0.5 10*3/uL (ref 0.1–1.0)
Monocytes Relative: 6 %
Neutro Abs: 3.9 10*3/uL (ref 1.7–7.7)
Neutrophils Relative %: 49 %
Platelets: 275 10*3/uL (ref 150–400)
RBC: 4 MIL/uL (ref 3.87–5.11)
RDW: 12.9 % (ref 11.5–15.5)
WBC: 7.7 10*3/uL (ref 4.0–10.5)
nRBC: 0 % (ref 0.0–0.2)

## 2019-11-20 LAB — POCT PREGNANCY, URINE: Preg Test, Ur: NEGATIVE

## 2019-11-20 NOTE — ED Triage Notes (Signed)
Patient ambulatory to triage with steady gait, without difficulty or distress noted, mask in place; abd pain Sunday then vag bleeding began on Monday and has persisted

## 2019-11-21 ENCOUNTER — Emergency Department: Payer: Medicaid Other

## 2019-11-21 ENCOUNTER — Emergency Department
Admission: EM | Admit: 2019-11-21 | Discharge: 2019-11-21 | Disposition: A | Discharge status: Home / Self Care | Payer: Medicaid Other | Attending: Student in an Organized Health Care Education/Training Program | Admitting: Student in an Organized Health Care Education/Training Program

## 2019-11-21 ENCOUNTER — Telehealth: Payer: Self-pay | Admitting: Emergency Medicine

## 2019-11-21 DIAGNOSIS — N72 Inflammatory disease of cervix uteri: Secondary | ICD-10-CM

## 2019-11-21 DIAGNOSIS — N939 Abnormal uterine and vaginal bleeding, unspecified: Secondary | ICD-10-CM | POA: Diagnosis not present

## 2019-11-21 DIAGNOSIS — R102 Pelvic and perineal pain: Secondary | ICD-10-CM

## 2019-11-21 DIAGNOSIS — A599 Trichomoniasis, unspecified: Secondary | ICD-10-CM | POA: Diagnosis not present

## 2019-11-21 DIAGNOSIS — A5901 Trichomonal vulvovaginitis: Secondary | ICD-10-CM

## 2019-11-21 LAB — CHLAMYDIA/NGC RT PCR (ARMC ONLY)
Chlamydia Tr: DETECTED — AB
N gonorrhoeae: DETECTED — AB

## 2019-11-21 LAB — URINALYSIS, COMPLETE (UACMP) WITH MICROSCOPIC
Bilirubin Urine: NEGATIVE
Glucose, UA: NEGATIVE mg/dL
Ketones, ur: NEGATIVE mg/dL
Nitrite: NEGATIVE
Protein, ur: 100 mg/dL — AB
RBC / HPF: >50 RBC/hpf — ABNORMAL HIGH (ref 0–5)
Specific Gravity, Urine: 1.029 (ref 1.005–1.030)
WBC, UA: >50 WBC/hpf — ABNORMAL HIGH (ref 0–5)
pH: 5 (ref 5.0–8.0)

## 2019-11-21 LAB — COMPREHENSIVE METABOLIC PANEL
ALT: 15 U/L (ref 0–44)
AST: 21 U/L (ref 15–41)
Albumin: 4.4 g/dL (ref 3.5–5.0)
Alkaline Phosphatase: 45 U/L (ref 38–126)
Anion gap: 10 (ref 5–15)
BUN: 16 mg/dL (ref 6–20)
CO2: 26 mmol/L (ref 22–32)
Calcium: 9.3 mg/dL (ref 8.9–10.3)
Chloride: 104 mmol/L (ref 98–111)
Creatinine, Ser: 0.6 mg/dL (ref 0.44–1.00)
GFR calc Af Amer: >60 mL/min (ref >60)
GFR calc non Af Amer: >60 mL/min (ref >60)
Glucose, Bld: 94 mg/dL (ref 70–99)
Potassium: 4.4 mmol/L (ref 3.5–5.1)
Sodium: 140 mmol/L (ref 135–145)
Total Bilirubin: 0.6 mg/dL (ref 0.3–1.2)
Total Protein: 7.4 g/dL (ref 6.5–8.1)

## 2019-11-21 LAB — WET PREP, GENITAL
Clue Cells Wet Prep HPF POC: NONE SEEN
Sperm: NONE SEEN
Yeast Wet Prep HPF POC: NONE SEEN

## 2019-11-21 LAB — HIV ANTIBODY (ROUTINE TESTING W REFLEX): HIV Screen 4th Generation wRfx: NONREACTIVE

## 2019-11-21 MED ORDER — SODIUM CHLORIDE 0.9 % IV SOLN
1.0000 g | Freq: Once | INTRAVENOUS | Status: DC
  Filled 2019-11-21: qty 10

## 2019-11-21 MED ORDER — CEFTRIAXONE SODIUM 250 MG IJ SOLR
250.0000 mg | Freq: Once | INTRAMUSCULAR | Status: AC
  Administered 2019-11-21: 250 mg via INTRAMUSCULAR
  Filled 2019-11-21: qty 250

## 2019-11-21 MED ORDER — ONDANSETRON 4 MG PO TBDP
ORAL_TABLET | ORAL | Status: AC
  Filled 2019-11-21: qty 1

## 2019-11-21 MED ORDER — AZITHROMYCIN 500 MG PO TABS
1000.0000 mg | ORAL_TABLET | Freq: Once | ORAL | Status: AC
  Administered 2019-11-21: 1000 mg via ORAL
  Filled 2019-11-21: qty 2

## 2019-11-21 MED ORDER — DOXYCYCLINE HYCLATE 100 MG PO TABS: 100.0000 mg | ORAL_TABLET | Freq: Two times a day (BID) | ORAL | 0 refills | Status: AC

## 2019-11-21 MED ORDER — ONDANSETRON HCL 4 MG PO TABS: 4.0000 mg | ORAL_TABLET | Freq: Every day | ORAL | 0 refills | Status: AC | PRN

## 2019-11-21 MED ORDER — METRONIDAZOLE 500 MG PO TABS: 500.0000 mg | ORAL_TABLET | Freq: Two times a day (BID) | ORAL | 0 refills | Status: AC

## 2019-11-21 MED ORDER — ONDANSETRON 4 MG PO TBDP
4.0000 mg | ORAL_TABLET | Freq: Once | ORAL | Status: AC
  Administered 2019-11-21: 4 mg via ORAL

## 2019-11-21 MED ORDER — METRONIDAZOLE 500 MG PO TABS: 500.0000 mg | ORAL_TABLET | Freq: Two times a day (BID) | ORAL | 0 refills | Status: DC

## 2019-11-21 NOTE — Discharge Instructions (Addendum)
You have been seen in the emergency department for emergency care. It is important that you contact your own doctor, specialist or the closest clinic for follow-up care. Please bring this instruction sheet, all medications and X-ray copies with you when you are seen for follow-up care.  Determining the exact cause for all patients with abdominal pain is extremely difficult in the emergency department. Our primary focus is to rule-out immediate life-threatening diseases. If no immediate source of pain is found the definitive diagnosis frequently needs to be determined over time.Many times your primary care physician can determine the cause by following the symptoms over time. Sometimes, specialist are required such as Gastroenterologists, Gynecologists, Urologists or Surgeons. Please return immediately to the Emergency Department for fever>101, Vomiting or Intractable Pain. You should return to the emergency department or see your primary care provider in 12-24hrs if your pain is no better and sooner if your pain becomes worse.'     IMPRESSION:  Normal pelvic ultrasound examination. Normal appearance of the  uterus and ovaries.        Electronically Signed    By: Rudie Meyer M.D.    On: 11/21/2019 05:41

## 2019-11-21 NOTE — ED Notes (Signed)
Patient instructed to check mychart for test results

## 2019-11-21 NOTE — ED Notes (Signed)
Patient had left room to go call her mom outside. Called patient multiple times to get patient to come back for treatment. Patient came back, explained to patient that she had not been discharged nor had her ultrasound yet. Patient is in agreement to stay for treatment

## 2019-11-21 NOTE — ED Provider Notes (Signed)
[facility] Emergency Department Provider Note    First MD Initiated Contact with Patient 11/21/19 0115     (approximate)  I have reviewed the triage vital signs and the nursing notes.   HISTORY  Chief Complaint Abdominal Pain    HPI Julie Potter is a 19 y.o. female the below the past medical history status post vaginal delivery back in November presents to the ER for persistent vaginal bleeding since then but then on Monday passed a large clot states that on Sunday she was having severe cramping abdominal pain.  Is worried that she is having miscarriage.  Also having some vaginal discharge.  She is still sexually active.  Is not had any fevers.  No nausea or vomiting.  Denies any pain right now.  Not currently on any birth control.    Past Medical History:  Diagnosis Date  . Anemia   . Heart murmur    Family History  Problem Relation Age of Onset  . Hyperlipidemia Mother   . Hyperlipidemia Maternal Grandmother   . COPD Maternal Grandmother   . Heart disease Maternal Grandmother   . Diabetes Paternal Grandfather    Past Surgical History:  Procedure Laterality Date  . NO PAST SURGERIES     Patient Active Problem List   Diagnosis Date Noted  . Depo-Provera contraceptive status 06/10/2019  . Insomnia 02/21/2018  . CARDIAC MURMUR 11/29/2007      Prior to Admission medications   Medication Sig Start Date End Date Taking? Authorizing Provider  doxycycline (VIBRA-TABS) 100 MG tablet Take 1 tablet (100 mg total) by mouth 2 (two) times daily for 7 days. 11/21/19 11/28/19  Merlyn Lot, MD  Elastic Bandages & Supports (COMFORT FIT MATERNITY SUPP SM) MISC Wear as directed. Patient not taking: Reported on 06/10/2019 04/11/19   Shelly Bombard, MD  ferrous sulfate (FERROUSUL) 325 (65 FE) MG tablet Take 1 tablet (325 mg total) by mouth 2 (two) times daily. Patient not taking: Reported on 06/10/2019 03/04/19   Lajean Manes, CNM  ibuprofen  (ADVIL) 600 MG tablet Take 1 tablet (600 mg total) by mouth every 6 (six) hours. Patient not taking: Reported on 06/10/2019 05/14/19   Nicolette Bang, DO  medroxyPROGESTERone (DEPO-PROVERA) 150 MG/ML injection Inject 1 mL (150 mg total) into the muscle every 3 (three) months. 06/10/19   Leftwich-Kirby, Kathie Dike, CNM  metroNIDAZOLE (FLAGYL) 500 MG tablet Take 1 tablet (500 mg total) by mouth 2 (two) times daily for 7 days. 11/21/19 11/28/19  Merlyn Lot, MD  ondansetron (ZOFRAN) 4 MG tablet Take 1 tablet (4 mg total) by mouth daily as needed. 11/21/19 11/20/20  Merlyn Lot, MD  Prenatal Multivit-Min-Fe-FA (PRENATAL VITAMINS) 0.8 MG tablet Take 1 tablet by mouth daily. Patient not taking: Reported on 06/10/2019 09/25/18   Donnamae Jude, MD  senna-docusate (SENOKOT-S) 8.6-50 MG tablet Take 2 tablets by mouth daily. Patient not taking: Reported on 06/10/2019 05/15/19   Nicolette Bang, DO    Allergies Patient has no known allergies.    Social History Social History   Tobacco Use  . Smoking status: Passive Smoke Exposure - Never Smoker  . Smokeless tobacco: Never Used  Substance Use Topics  . Alcohol use: No  . Drug use: No    Review of Systems Patient denies headaches, rhinorrhea, blurry vision, numbness, shortness of breath, chest pain, edema, cough, abdominal pain, nausea, vomiting, diarrhea, dysuria, fevers, rashes or hallucinations unless otherwise stated above in HPI. ____________________________________________  PHYSICAL EXAM:  VITAL SIGNS: Vitals:   11/20/19 2332 11/21/19 0532  BP: (!) 149/72 137/73  Pulse: (!) 56 60  Resp: 18   Temp: 98.4 F (36.9 C)   SpO2: 99% 99%    Constitutional: Alert and oriented.  Eyes: Conjunctivae are normal.  Head: Atraumatic. Nose: No congestion/rhinnorhea. Mouth/Throat: Mucous membranes are moist.   Neck: No stridor. Painless ROM.  Cardiovascular: Normal rate, regular rhythm. Grossly normal heart sounds.  Good  peripheral circulation. Respiratory: Normal respiratory effort.  No retractions. Lungs CTAB. Gastrointestinal: Soft and nontender. No distention. No abdominal bruits. No CVA tenderness. Genitourinary: Normal external genitalia.  Cervix does appear slightly irritated but no evidence of purulent discharge.  No lesions noted.  No CMT,  No adnexal ttp Musculoskeletal: No lower extremity tenderness nor edema.  No joint effusions. Neurologic:  Normal speech and language. No gross focal neurologic deficits are appreciated. No facial droop Skin:  Skin is warm, dry and intact. No rash noted. Psychiatric: Mood and affect are normal. Speech and behavior are normal.  ____________________________________________   LABS (all labs ordered are listed, but only abnormal results are displayed)  Results for orders placed or performed during the hospital encounter of 11/21/19 (from the past 24 hour(s))  CBC with Differential     Status: Abnormal   Collection Time: 11/20/19 11:36 PM  Result Value Ref Range   WBC 7.7 4.0 - 10.5 K/uL   RBC 4.00 3.87 - 5.11 MIL/uL   Hemoglobin 11.4 (L) 12.0 - 15.0 g/dL   HCT 81.2 (L) 75.1 - 70.0 %   MCV 87.5 80.0 - 100.0 fL   MCH 28.5 26.0 - 34.0 pg   MCHC 32.6 30.0 - 36.0 g/dL   RDW 17.4 94.4 - 96.7 %   Platelets 275 150 - 400 K/uL   nRBC 0.0 0.0 - 0.2 %   Neutrophils Relative % 49 %   Neutro Abs 3.9 1.7 - 7.7 K/uL   Lymphocytes Relative 42 %   Lymphs Abs 3.2 0.7 - 4.0 K/uL   Monocytes Relative 6 %   Monocytes Absolute 0.5 0.1 - 1.0 K/uL   Eosinophils Relative 2 %   Eosinophils Absolute 0.1 0.0 - 0.5 K/uL   Basophils Relative 1 %   Basophils Absolute 0.1 0.0 - 0.1 K/uL   Immature Granulocytes 0 %   Abs Immature Granulocytes 0.01 0.00 - 0.07 K/uL  Comprehensive metabolic panel     Status: None   Collection Time: 11/20/19 11:36 PM  Result Value Ref Range   Sodium 140 135 - 145 mmol/L   Potassium 4.4 3.5 - 5.1 mmol/L   Chloride 104 98 - 111 mmol/L   CO2 26 22 -  32 mmol/L   Glucose, Bld 94 70 - 99 mg/dL   BUN 16 6 - 20 mg/dL   Creatinine, Ser 5.91 0.44 - 1.00 mg/dL   Calcium 9.3 8.9 - 63.8 mg/dL   Total Protein 7.4 6.5 - 8.1 g/dL   Albumin 4.4 3.5 - 5.0 g/dL   AST 21 15 - 41 U/L   ALT 15 0 - 44 U/L   Alkaline Phosphatase 45 38 - 126 U/L   Total Bilirubin 0.6 0.3 - 1.2 mg/dL   GFR calc non Af Amer >60 >60 mL/min   GFR calc Af Amer >60 >60 mL/min   Anion gap 10 5 - 15  Urinalysis, Complete w Microscopic     Status: Abnormal   Collection Time: 11/20/19 11:36 PM  Result Value Ref Range  Color, Urine YELLOW (A) YELLOW   APPearance HAZY (A) CLEAR   Specific Gravity, Urine 1.029 1.005 - 1.030   pH 5.0 5.0 - 8.0   Glucose, UA NEGATIVE NEGATIVE mg/dL   Hgb urine dipstick LARGE (A) NEGATIVE   Bilirubin Urine NEGATIVE NEGATIVE   Ketones, ur NEGATIVE NEGATIVE mg/dL   Protein, ur 378 (A) NEGATIVE mg/dL   Nitrite NEGATIVE NEGATIVE   Leukocytes,Ua MODERATE (A) NEGATIVE   RBC / HPF >50 (H) 0 - 5 RBC/hpf   WBC, UA >50 (H) 0 - 5 WBC/hpf   Bacteria, UA RARE (A) NONE SEEN   Squamous Epithelial / LPF 11-20 0 - 5   Mucus PRESENT   Pregnancy, urine POC     Status: None   Collection Time: 11/20/19 11:38 PM  Result Value Ref Range   Preg Test, Ur NEGATIVE NEGATIVE  Chlamydia/NGC rt PCR ([facility] only)     Status: Abnormal   Collection Time: 11/21/19  1:50 AM  Result Value Ref Range   Specimen source GC/Chlam ENDOCERVICAL    Chlamydia Tr DETECTED (A) NOT DETECTED   N gonorrhoeae DETECTED (A) NOT DETECTED  Wet prep, genital     Status: Abnormal   Collection Time: 11/21/19  1:50 AM  Result Value Ref Range   Yeast Wet Prep HPF POC NONE SEEN NONE SEEN   Trich, Wet Prep PRESENT (A) NONE SEEN   Clue Cells Wet Prep HPF POC NONE SEEN NONE SEEN   WBC, Wet Prep HPF POC FEW (A) NONE SEEN   Sperm NONE SEEN    ____________________________________________  EKG ____________________________________________  RADIOLOGY  I personally reviewed all radiographic  images ordered to evaluate for the above acute complaints and reviewed radiology reports and findings.  These findings were personally discussed with the patient.  Please see medical record for radiology report.  ____________________________________________   PROCEDURES  Procedure(s) performed:  Procedures    Critical Care performed: no ____________________________________________   INITIAL IMPRESSION / ASSESSMENT AND PLAN / ED COURSE  Pertinent labs & imaging results that were available during my care of the patient were reviewed by me and considered in my medical decision making (see chart for details).   DDX: ectopic, retained poc, pid, uti, cervicitis, toa, cyst,  menses  Julie Potter is a 19 y.o. who presents to the ED with symptoms as described above.  Patient pleasant nontoxic-appearing.  Ultrasound blood work and pelvic exam will be performed for the blood differential.  Clinical Course as of Nov 20 557  Thu Nov 21, 2019  0424 She cc he did come back positive.  Will cover for PID given her symptoms.   [PR]  [phone] Notified by nursing staff the patient had eloped from the ER.  She left prior to receiving antibiotics for infection.  I plan to treat for PID as she was complaining of severe pain. Ultrasound was not completed.  Will attempt to contact patient to instruct her to return to the ER for completion of her work-up and treatment.   [PR]  [phone] Patient was just outside.  Did come back for ultrasound.  Just informed her of the results.  Patient consents to treatment as well as being screened for HIV.  Have discussed with the patient and available family all diagnostics and treatments performed thus far and all questions were answered to the best of my ability. The patient demonstrates understanding and agreement with plan.    [PR]    Clinical Course User Index [PR] Willy Eddy, MD  The patient was evaluated in Emergency Department today for the symptoms  described in the history of present illness. He/she was evaluated in the context of the global COVID-19 pandemic, which necessitated consideration that the patient might be at risk for infection with the SARS-CoV-2 virus that causes COVID-19. Institutional protocols and algorithms that pertain to the evaluation of patients at risk for COVID-19 are in a state of rapid change based on information released by regulatory bodies including the CDC and federal and state organizations. These policies and algorithms were followed during the patient's care in the ED.  As part of my medical decision making, I reviewed the following data within the electronic MEDICAL RECORD NUMBER Nursing notes reviewed and incorporated, Labs reviewed, notes from prior ED visits and [place] Controlled Substance Database   ____________________________________________   FINAL CLINICAL IMPRESSION(S) / ED DIAGNOSES  Final diagnoses:  Trichomoniasis of vagina  Pelvic pain  Cervicitis      NEW MEDICATIONS STARTED DURING THIS VISIT:  New Prescriptions   DOXYCYCLINE (VIBRA-TABS) 100 MG TABLET    Take 1 tablet (100 mg total) by mouth 2 (two) times daily for 7 days.   METRONIDAZOLE (FLAGYL) 500 MG TABLET    Take 1 tablet (500 mg total) by mouth 2 (two) times daily for 7 days.   ONDANSETRON (ZOFRAN) 4 MG TABLET    Take 1 tablet (4 mg total) by mouth daily as needed.     Note:  This document was prepared using Dragon voice recognition software and may include unintentional dictation errors.    Willy Eddy, MD 11/21/19 [phone]

## 2019-11-21 NOTE — Telephone Encounter (Signed)
Called patient and informed her of std results.  hiv is not back yet, but I told her I would call only if positive.  Also told her it would be in [place] when it was back.  She is going to pick up her antibiotics today.  Informed of free treatment for partner at [facility].

## 2019-11-21 NOTE — ED Notes (Signed)
Patient is in ultrasound.

## 2020-04-10 DIAGNOSIS — R519 Headache, unspecified: Secondary | ICD-10-CM | POA: Diagnosis not present

## 2020-04-10 DIAGNOSIS — K120 Recurrent oral aphthae: Secondary | ICD-10-CM | POA: Diagnosis not present

## 2020-04-10 DIAGNOSIS — R82998 Other abnormal findings in urine: Secondary | ICD-10-CM | POA: Diagnosis not present

## 2020-05-04 DIAGNOSIS — Z111 Encounter for screening for respiratory tuberculosis: Secondary | ICD-10-CM | POA: Diagnosis not present

## 2020-07-08 ENCOUNTER — Other Ambulatory Visit: Payer: Self-pay

## 2020-07-08 ENCOUNTER — Ambulatory Visit: Payer: Medicaid Other | Admitting: Physician Assistant

## 2020-07-08 ENCOUNTER — Encounter: Payer: Self-pay | Admitting: Physician Assistant

## 2020-07-08 DIAGNOSIS — Z3202 Encounter for pregnancy test, result negative: Secondary | ICD-10-CM | POA: Diagnosis not present

## 2020-07-08 DIAGNOSIS — Z113 Encounter for screening for infections with a predominantly sexual mode of transmission: Secondary | ICD-10-CM

## 2020-07-08 DIAGNOSIS — Z3009 Encounter for other general counseling and advice on contraception: Secondary | ICD-10-CM

## 2020-07-08 LAB — WET PREP FOR TRICH, YEAST, CLUE
Trichomonas Exam: NEGATIVE
Yeast Exam: NEGATIVE

## 2020-07-08 LAB — PREGNANCY, URINE: Preg Test, Ur: NEGATIVE

## 2020-07-08 NOTE — Progress Notes (Addendum)
PT negative. [employer] results reviewed. Per standing orders no treatment indicated. Patient to [facility] to receive initial Covid Vaccine. Tawny Hopping, RN

## 2020-07-08 NOTE — Progress Notes (Signed)
[facility] Department STI clinic/screening visit  Subjective:  Julie Potter is a 19 y.o. female being seen today for an STI screening visit. The patient reports they do not have symptoms.  Patient reports that they do not desire a pregnancy in the next year.   They reported they are not interested in discussing contraception today.  Patient's last menstrual period was 05/22/2020 (approximate).   Patient has the following medical conditions:   Patient Active Problem List   Diagnosis Date Noted  . Depo-Provera contraceptive status 06/10/2019  . Insomnia 02/21/2018  . First degree heart block 11/02/2013  . Palpitations 11/02/2013  . CARDIAC MURMUR 11/29/2007    Chief Complaint  Patient presents with  . SEXUALLY TRANSMITTED DISEASE    Screening    HPI  Patient reports that she sometimes has slight odor and discharge but does not have any currently.  Reports that she delivered on 05/11/2020, and got a Depo before leaving the hospital and then bled for about 2.5 months.  Did not have a period in 08/2019, then started having regular periods in January of this year.  States that she is not having any typical symptoms of pregnancy but has not had a period this month, so even though she has had 2 negative pregnancy tests at home, she would like one today.  Reports last HIV test was about 1 year ago and has not had a pap yet.  See flowsheet for further details and programmatic requirements.    The following portions of the patient's history were reviewed and updated as appropriate: allergies, current medications, past medical history, past social history, past surgical history and problem list.  Objective:  There were no vitals filed for this visit.  Physical Exam Constitutional:      General: She is not in acute distress.    Appearance: Normal appearance.  HENT:     Head: Normocephalic and atraumatic.     Comments: No nits,lice, or hair loss. No cervical,  supraclavicular or axillary adenopathy.    Mouth/Throat:     Mouth: Mucous membranes are moist.     Pharynx: Oropharynx is clear. No oropharyngeal exudate or posterior oropharyngeal erythema.  Eyes:     Conjunctiva/sclera: Conjunctivae normal.  Pulmonary:     Effort: Pulmonary effort is normal.  Abdominal:     Palpations: Abdomen is soft. There is no mass.     Tenderness: There is no abdominal tenderness. There is no guarding or rebound.  Genitourinary:    General: Normal vulva.     Rectum: Normal.     Comments: External genitalia/pubic area without nits, lice, edema, erythema, lesions and inguinal adenopathy. Vagina with normal mucosa and discharge. Cervix without visible lesions. Uterus firm, mobile, nt, no masses, no CMT, no adnexal tenderness or fullness. Musculoskeletal:     Cervical back: Neck supple. No tenderness.  Skin:    General: Skin is warm and dry.     Findings: No bruising, erythema, lesion or rash.  Neurological:     Mental Status: She is alert and oriented to person, place, and time.  Psychiatric:        Mood and Affect: Mood normal.        Behavior: Behavior normal.        Thought Content: Thought content normal.        Judgment: Judgment normal.      Assessment and Plan:  Julie Potter is a 19 y.o. female presenting to the [facility] Department for  STI screening  1. Screening for STD (sexually transmitted disease) Patient into clinic without symptoms.  Counseled patient that it can be normal to have occasional slight odor, especially after sex or a period and it usually resolves on its own. Rec condoms with all sex. Await test results.  Counseled that RN will call if needs to RTC for treatment once results are back. - WET PREP FOR TRICH, YEAST, CLUE - Pregnancy, urine - Chlamydia/Gonorrhea [place] [facility] - HIV [facility] - Syphilis Serology, [place] [facility]  2. Encounter for counseling regarding contraception Counseled patient re:  option of condoms and spermicide as BCM since is not interested in hormonal options or non-hormonal IUD. Counseled patient re:  SE of Depo and that until 18 months after last depo could have skipped/irregular periods. Enc to repeat a pregnancy test at home if she does not have a period in the next 2 weeks. Counseled that usually for a urine pregnancy test to be positive, she would have to be 12-14 days pregnant.     No follow-ups on file.  No future appointments.  Matt Holmes, PA

## 2020-07-08 NOTE — Progress Notes (Addendum)
Patient here today for STD screening and PT.  Accepts bloodwork. Tawny Hopping, RN

## 2020-07-15 ENCOUNTER — Telehealth: Payer: Self-pay

## 2020-07-15 NOTE — Telephone Encounter (Signed)
Call to patient. Reviewed positive chlamydia. Patient scheduled for tx on 07/16/2020 at 2pm. Patient instructed to eat before appt. All questions answered.   Harvie Heck, RN

## 2020-07-22 DIAGNOSIS — Z20822 Contact with and (suspected) exposure to covid-19: Secondary | ICD-10-CM | POA: Diagnosis not present

## 2020-07-28 ENCOUNTER — Telehealth: Payer: Self-pay

## 2020-07-28 NOTE — Telephone Encounter (Signed)
Call to patient, missed tx appt. Patient rescheduled for tx appt 11/17. Patient Instructed to not have sex and to eat before appt arrival.   Harvie Heck, RN

## 2020-07-29 ENCOUNTER — Ambulatory Visit: Payer: Self-pay

## 2020-07-29 ENCOUNTER — Other Ambulatory Visit: Payer: Self-pay

## 2020-07-29 DIAGNOSIS — Z3009 Encounter for other general counseling and advice on contraception: Secondary | ICD-10-CM

## 2020-07-29 DIAGNOSIS — A749 Chlamydial infection, unspecified: Secondary | ICD-10-CM

## 2020-07-29 MED ORDER — AZITHROMYCIN 500 MG PO TABS
1000.0000 mg | ORAL_TABLET | Freq: Once | ORAL | Status: AC
  Administered 2020-07-29: 1000 mg via ORAL

## 2020-07-29 NOTE — Progress Notes (Signed)
Here for chlamydia treatment. LMP 07/01/2020. Denies using any type birth control method and admits to having unprotected sex in past 2 weeks. Treated with Azithromycin 1 gram by mouth once per SO Dr. Alvester Morin as pregnancy cannot be ruled out.  Pt advised to eat after leaving appt today and to call to be retreated  if vomits within 2 hrs. Pt in agreement. Contraception counseling done as pt reports not planning a pregnancy and using no bcm. Pt plans to think about options and to call [facility] if desires a bcm. Jerel Shepherd, RN

## 2020-08-25 ENCOUNTER — Encounter: Payer: Self-pay | Admitting: Family Medicine

## 2021-04-05 DIAGNOSIS — R0781 Pleurodynia: Secondary | ICD-10-CM | POA: Diagnosis not present

## 2021-04-05 DIAGNOSIS — Z113 Encounter for screening for infections with a predominantly sexual mode of transmission: Secondary | ICD-10-CM | POA: Diagnosis not present

## 2021-05-05 DIAGNOSIS — H5213 Myopia, bilateral: Secondary | ICD-10-CM | POA: Diagnosis not present

## 2021-12-02 DIAGNOSIS — N898 Other specified noninflammatory disorders of vagina: Secondary | ICD-10-CM | POA: Diagnosis not present

## 2022-01-04 DIAGNOSIS — J029 Acute pharyngitis, unspecified: Secondary | ICD-10-CM | POA: Diagnosis not present

## 2022-01-04 DIAGNOSIS — R319 Hematuria, unspecified: Secondary | ICD-10-CM | POA: Diagnosis not present

## 2022-01-28 ENCOUNTER — Other Ambulatory Visit: Payer: Self-pay | Admitting: Family Medicine

## 2022-01-28 ENCOUNTER — Other Ambulatory Visit (HOSPITAL_COMMUNITY): Payer: Self-pay | Admitting: Family Medicine

## 2022-01-28 DIAGNOSIS — Z808 Family history of malignant neoplasm of other organs or systems: Secondary | ICD-10-CM | POA: Diagnosis not present

## 2022-01-28 DIAGNOSIS — E049 Nontoxic goiter, unspecified: Secondary | ICD-10-CM | POA: Diagnosis not present

## 2022-01-28 DIAGNOSIS — Z0000 Encounter for general adult medical examination without abnormal findings: Secondary | ICD-10-CM | POA: Diagnosis not present

## 2022-01-28 DIAGNOSIS — Z124 Encounter for screening for malignant neoplasm of cervix: Secondary | ICD-10-CM | POA: Diagnosis not present

## 2022-02-03 ENCOUNTER — Emergency Department
Admission: EM | Admit: 2022-02-03 | Discharge: 2022-02-03 | Disposition: A | Discharge status: Home / Self Care | Payer: Medicaid Other | Attending: Emergency Medicine | Admitting: Emergency Medicine

## 2022-02-03 ENCOUNTER — Emergency Department: Payer: Medicaid Other

## 2022-02-03 ENCOUNTER — Other Ambulatory Visit: Payer: Self-pay

## 2022-02-03 DIAGNOSIS — R319 Hematuria, unspecified: Secondary | ICD-10-CM | POA: Insufficient documentation

## 2022-02-03 DIAGNOSIS — R1031 Right lower quadrant pain: Secondary | ICD-10-CM | POA: Diagnosis present

## 2022-02-03 DIAGNOSIS — A749 Chlamydial infection, unspecified: Secondary | ICD-10-CM | POA: Diagnosis not present

## 2022-02-03 DIAGNOSIS — R109 Unspecified abdominal pain: Secondary | ICD-10-CM | POA: Diagnosis not present

## 2022-02-03 DIAGNOSIS — M545 Low back pain, unspecified: Secondary | ICD-10-CM | POA: Diagnosis not present

## 2022-02-03 LAB — CBC WITH DIFFERENTIAL/PLATELET
Abs Immature Granulocytes: 0 10*3/uL (ref 0.00–0.07)
Basophils Absolute: 0.1 10*3/uL (ref 0.0–0.1)
Basophils Relative: 1 %
Eosinophils Absolute: 0.1 10*3/uL (ref 0.0–0.5)
Eosinophils Relative: 3 %
HCT: 39.7 % (ref 36.0–46.0)
Hemoglobin: 13.1 g/dL (ref 12.0–15.0)
Immature Granulocytes: 0 %
Lymphocytes Relative: 49 %
Lymphs Abs: 2.3 10*3/uL (ref 0.7–4.0)
MCH: 29.3 pg (ref 26.0–34.0)
MCHC: 33 g/dL (ref 30.0–36.0)
MCV: 88.8 fL (ref 80.0–100.0)
Monocytes Absolute: 0.3 10*3/uL (ref 0.1–1.0)
Monocytes Relative: 7 %
Neutro Abs: 1.8 10*3/uL (ref 1.7–7.7)
Neutrophils Relative %: 40 %
Platelets: 291 10*3/uL (ref 150–400)
RBC: 4.47 MIL/uL (ref 3.87–5.11)
RDW: 11.9 % (ref 11.5–15.5)
WBC: 4.6 10*3/uL (ref 4.0–10.5)
nRBC: 0 % (ref 0.0–0.2)

## 2022-02-03 LAB — COMPREHENSIVE METABOLIC PANEL
ALT: 11 U/L (ref 0–44)
AST: 24 U/L (ref 15–41)
Albumin: 4.1 g/dL (ref 3.5–5.0)
Alkaline Phosphatase: 49 U/L (ref 38–126)
Anion gap: 6 (ref 5–15)
BUN: 17 mg/dL (ref 6–20)
CO2: 27 mmol/L (ref 22–32)
Calcium: 9.3 mg/dL (ref 8.9–10.3)
Chloride: 104 mmol/L (ref 98–111)
Creatinine, Ser: 0.77 mg/dL (ref 0.44–1.00)
GFR, Estimated: >60 mL/min (ref >60)
Glucose, Bld: 92 mg/dL (ref 70–99)
Potassium: 4 mmol/L (ref 3.5–5.1)
Sodium: 137 mmol/L (ref 135–145)
Total Bilirubin: 1 mg/dL (ref 0.3–1.2)
Total Protein: 7.7 g/dL (ref 6.5–8.1)

## 2022-02-03 LAB — POC URINE PREG, ED: Preg Test, Ur: NEGATIVE

## 2022-02-03 LAB — WET PREP, GENITAL
Clue Cells Wet Prep HPF POC: NONE SEEN
Sperm: NONE SEEN
Trich, Wet Prep: NONE SEEN
WBC, Wet Prep HPF POC: <10 (ref <10)
Yeast Wet Prep HPF POC: NONE SEEN

## 2022-02-03 LAB — LIPASE, BLOOD: Lipase: 30 U/L (ref 11–51)

## 2022-02-03 LAB — URINALYSIS, ROUTINE W REFLEX MICROSCOPIC
Bilirubin Urine: NEGATIVE
Glucose, UA: NEGATIVE mg/dL
Ketones, ur: NEGATIVE mg/dL
Leukocytes,Ua: NEGATIVE
Nitrite: NEGATIVE
Protein, ur: 100 mg/dL — AB
RBC / HPF: >50 RBC/hpf — ABNORMAL HIGH (ref 0–5)
Specific Gravity, Urine: 1.015 (ref 1.005–1.030)
pH: 6 (ref 5.0–8.0)

## 2022-02-03 LAB — CHLAMYDIA/NGC RT PCR (ARMC ONLY)
Chlamydia Tr: DETECTED — AB
N gonorrhoeae: NOT DETECTED

## 2022-02-03 MED ORDER — DOXYCYCLINE HYCLATE 100 MG PO CAPS: 100.0000 mg | ORAL_CAPSULE | Freq: Two times a day (BID) | ORAL | 0 refills | Status: DC

## 2022-02-03 MED ORDER — NAPROXEN 500 MG PO TABS: 500.0000 mg | ORAL_TABLET | Freq: Two times a day (BID) | ORAL | 0 refills | Status: DC

## 2022-02-03 NOTE — ED Notes (Signed)
See triage note  presents with lower abd pain and back pain  states she was recently treated for UTI  was also seen by her PCP on Friday was told everything was good    states she is still having that pain  no fever or n/v

## 2022-02-03 NOTE — ED Triage Notes (Signed)
Pt c/o lower abd pain with pressure into the back for the past 2 weeks, states it started before her period but states her period stopped 3 days ago and it has persisted , pt is in NAD on arrival, ambulatory with a steady gait

## 2022-02-03 NOTE — ED Provider Notes (Signed)
[facility] Provider Note    Event Date/Time   First MD Initiated Contact with Patient 02/03/22 [id number]     (approximate)   History   Abdominal Pain   HPI  Julie Potter is a 21 y.o. female presents to the ED with complaint of lower abdominal pressure with pain into the back for the last 2 weeks that cleared and then returned with radiation to the right lower abdomen.  Patient reports that she was seen seen in [facility] on 12/02/2021 at which time she was treated for gonorrhea and chlamydia and her test results was positive for chlamydia.  Just recently [facility] was seen at [facility] after she had some hematuria without urinary symptoms and was told that she had a urinary tract infection.  She states that she took all the antibiotic until completely finished.  Today she has had some discomfort on the right side without gross hematuria noted.     Physical Exam   Triage Vital Signs: ED Triage Vitals  Enc Vitals Group     BP 02/03/22 0935 (!) 146/69     Pulse Rate 02/03/22 0935 66     Resp 02/03/22 0935 14     Temp 02/03/22 0935 98.1 F (36.7 C)     Temp Source 02/03/22 0935 Oral     SpO2 02/03/22 0935 99 %     Weight 02/03/22 0936 148 lb (67.1 kg)     Height 02/03/22 0936 5\' 4"  (1.626 m)     Head Circumference --      Peak Flow --      Pain Score 02/03/22 0936 6     Pain Loc --      Pain Edu? --      Excl. in GC? --     Most recent vital signs: Vitals:   02/03/22 0935 02/03/22 1238  BP: (!) 146/69 140/70  Pulse: 66 68  Resp: 14 14  Temp: 98.1 F (36.7 C)   SpO2: 99% 99%     General: Awake, no distress.  CV:  Good peripheral perfusion.  Heart regular rate and rhythm. Resp:  Normal effort.  Lungs are clear bilaterally. Abd:  No distention.  Soft, minimal tenderness on palpation of the right lower quadrant and over the suprapubic area.  Bowel sounds normoactive x4 quadrants.  No CVA tenderness is appreciated.  No difficulty with range of motion  or increased pain.  Patient is ambulatory without any assistance. Other:     ED Results / Procedures / Treatments   Labs (all labs ordered are listed, but only abnormal results are displayed) Labs Reviewed  CHLAMYDIA/NGC RT PCR ([facility] ONLY)           - Abnormal; Notable for the following components:      Result Value   Chlamydia Tr DETECTED (*)    All other components within normal limits  URINALYSIS, ROUTINE W REFLEX MICROSCOPIC - Abnormal; Notable for the following components:   Color, Urine YELLOW (*)    APPearance HAZY (*)    Hgb urine dipstick LARGE (*)    Protein, ur 100 (*)    RBC / HPF >50 (*)    Bacteria, UA RARE (*)    All other components within normal limits  WET PREP, GENITAL  COMPREHENSIVE METABOLIC PANEL  CBC WITH DIFFERENTIAL/PLATELET  LIPASE, BLOOD  POC URINE PREG, ED     RADIOLOGY     PROCEDURES:  Critical Care performed:   Procedures   MEDICATIONS  ORDERED IN ED: Medications - No data to display   IMPRESSION / MDM / ASSESSMENT AND PLAN / ED COURSE  I reviewed the triage vital signs and the nursing notes.   Differential diagnosis includes, but is not limited to, urinary tract infection, kidney stone, vaginal discharge, bacterial vaginosis, right low back and right lower quadrant pain.  21 year old female presents to the ED with complaint of right low back pain radiating around to her right lower quadrant.  Patient states that she had gross hematuria for less than 1 day and was seen at [facility] where she was treated for a urinary tract infection with an antibiotic.  Patient states she has not had any problems until now.  Today she has pain but has not seen any blood.  Urinalysis was reviewed and patient has greater than 50,000 RBCs.  Bacteria is rare and WBCs were 0-5.  Remaining lab work included CBC, CMP, pregnancy, lipase and wet prep were all negative.  At the time the end of the chart was dictated the chlamydia and GC test had resulted with a  positive chlamydia test.  Patient was called at 2:26 PM and told that she is positive and that an antibiotic is being sent to her pharmacy to take twice a day for the next 7 days.  She was also told not to have intercourse until she knows that her partner has been treated for the same.  Patient is also advised to follow-up with urology to explain the hematuria that she has had on one occasion and microscopic hematuria that she has today.        FINAL CLINICAL IMPRESSION(S) / ED DIAGNOSES   Final diagnoses:  Hematuria, unspecified type  Chlamydia infection     Rx / DC Orders   ED Discharge Orders          Ordered    naproxen (NAPROSYN) 500 MG tablet  2 times daily with meals        02/03/22 1307    doxycycline (VIBRAMYCIN) 100 MG capsule  2 times daily        02/03/22 1423             Note:  This document was prepared using Dragon voice recognition software and may include unintentional dictation errors.   Tommi Rumps, PA-C 02/03/22 1430    Chesley Noon, MD 02/03/22 1659

## 2022-02-03 NOTE — Discharge Instructions (Addendum)
Call make an appoint with Dr. Bernardo Heater for further evaluation of your hematuria.  An anti-inflammatory was sent to your pharmacy to take for inflammation which will also help with your left hip bursitis that you saw for your primary care doctor.  Discontinue taking the ibuprofen but if additional pain medication is needed you may take Tylenol with this medication.

## 2022-02-03 NOTE — ED Notes (Signed)
Pt given self-swab kits and educated.

## 2022-02-11 ENCOUNTER — Other Ambulatory Visit: Payer: Self-pay

## 2022-02-15 ENCOUNTER — Ambulatory Visit
Admission: RE | Admit: 2022-02-15 | Discharge: 2022-02-15 | Disposition: A | Discharge status: Home / Self Care | Payer: Medicaid Other | Source: Ambulatory Visit | Attending: Family Medicine | Admitting: Family Medicine

## 2022-02-15 DIAGNOSIS — E049 Nontoxic goiter, unspecified: Secondary | ICD-10-CM | POA: Diagnosis not present

## 2022-05-27 ENCOUNTER — Telehealth: Payer: Self-pay | Admitting: Obstetrics and Gynecology

## 2022-05-27 NOTE — Telephone Encounter (Signed)
[facility] referring for contraceptive education. Paper records. Sch w any provider. I contacted patient via phone. I left voicemail for patient to call back to be scheduled.

## 2022-06-01 NOTE — Telephone Encounter (Signed)
Speaking with pt to schedule appt.  The phone call dropped during the conversation.  Will call back on 06/02/2022.

## 2022-06-15 ENCOUNTER — Encounter: Payer: Self-pay | Admitting: Licensed Practical Nurse

## 2022-07-13 NOTE — Progress Notes (Unsigned)
PCP:  [facility]   No chief complaint on file.    HPI:      [name] is a 21 y.o. [id number] whose LMP was No LMP recorded., presents today for her NP annual examination.  Her menses are {norm/abn:715}, lasting {number: 22536} days.  Dysmenorrhea {dysmen:716}. She {does:18564} have intermenstrual bleeding.  Sex activity: {sex active: 315163}.  Last Pap: {RJJO:[id number]}  Results were: {norm/abn:16707::"no abnormalities"} /neg HPV DNA *** Hx of STDs: {STD hx:14358}  There is no FH of breast cancer. There is no FH of ovarian cancer. The patient {does:18564} do self-breast exams.  Tobacco use: {tob:20664} Alcohol use: {Alcohol:11675} No drug use.  Exercise: {exercise:31265}  She {does:18564} get adequate calcium and Vitamin D in her diet. Labs with PCP; being treated for hyperlipidemia  Patient Active Problem List   Diagnosis Date Noted   Depo-Provera contraceptive status 06/10/2019   Insomnia 02/21/2018   First degree heart block 11/02/2013   Palpitations 11/02/2013   CARDIAC MURMUR 11/29/2007    Past Surgical History:  Procedure Laterality Date   NO PAST SURGERIES      Family History  Problem Relation Age of Onset   Hyperlipidemia Mother    Hyperlipidemia Maternal Grandmother    COPD Maternal Grandmother    Heart disease Maternal Grandmother    Diabetes Paternal Grandfather     Social History   Socioeconomic History   Marital status: Single    Spouse name: Not on file   Number of children: Not on file   Years of education: Not on file   Highest education level: Not on file  Occupational History   Not on file  Tobacco Use   Smoking status: Passive Smoke Exposure - Never Smoker   Smokeless tobacco: Never  Vaping Use   Vaping Use: Never used  Substance and Sexual Activity   Alcohol use: No   Drug use: No   Sexual activity: Yes    Partners: Male    Birth control/protection: None  Other Topics Concern   Not on file  Social History  Narrative   Not on file   Social Determinants of Health   Financial Resource Strain: Not on file  Food Insecurity: Not on file  Transportation Needs: Not on file  Physical Activity: Not on file  Stress: Not on file  Social Connections: Not on file  Intimate Partner Violence: Not At Risk (05/12/2019)   Humiliation, Afraid, Rape, and Kick questionnaire    Fear of Current or Ex-Partner: No    Emotionally Abused: No    Physically Abused: No    Sexually Abused: No     Current Outpatient Medications:    atorvastatin (LIPITOR) 10 MG tablet, Take 10 mg by mouth daily., Disp: , Rfl:    doxycycline (VIBRAMYCIN) 100 MG capsule, Take 1 capsule (100 mg total) by mouth 2 (two) times daily., Disp: 14 capsule, Rfl: 0   medroxyPROGESTERone (DEPO-PROVERA) 150 MG/ML injection, Inject 1 mL (150 mg total) into the muscle every 3 (three) months. (Patient not taking: Reported on 07/08/2020), Disp: 1 mL, Rfl: 0   naproxen (NAPROSYN) 500 MG tablet, Take 1 tablet (500 mg total) by mouth 2 (two) times daily with a meal., Disp: 30 tablet, Rfl: 0     ROS:  Review of Systems BREAST: No symptoms   Objective: There were no vitals taken for this visit.   OBGyn Exam  Results: No results found for this or any previous visit (from the past 24 hour(s)).  Assessment/Plan: No diagnosis found.  No orders of the defined types were placed in this encounter.            GYN counsel {counseling: 16159}     F/U  No follow-ups on file.  Fauna Neuner B. Jager Koska, PA-C 07/13/2022 8:45 PM

## 2022-07-14 ENCOUNTER — Other Ambulatory Visit (HOSPITAL_COMMUNITY)
Admission: RE | Admit: 2022-07-14 | Discharge: 2022-07-14 | Disposition: A | Discharge status: Home / Self Care | Payer: Medicaid Other | Source: Ambulatory Visit | Attending: Licensed Practical Nurse | Admitting: Licensed Practical Nurse

## 2022-07-14 ENCOUNTER — Ambulatory Visit: Payer: Medicaid Other | Admitting: Obstetrics and Gynecology

## 2022-07-14 ENCOUNTER — Encounter: Payer: Self-pay | Admitting: Obstetrics and Gynecology

## 2022-07-14 VITALS — BP 110/80 | Ht 63.0 in | Wt 151.0 lb

## 2022-07-14 DIAGNOSIS — Z23 Encounter for immunization: Secondary | ICD-10-CM

## 2022-07-14 DIAGNOSIS — Z30011 Encounter for initial prescription of contraceptive pills: Secondary | ICD-10-CM | POA: Diagnosis not present

## 2022-07-14 DIAGNOSIS — Z113 Encounter for screening for infections with a predominantly sexual mode of transmission: Secondary | ICD-10-CM | POA: Insufficient documentation

## 2022-07-14 DIAGNOSIS — A749 Chlamydial infection, unspecified: Secondary | ICD-10-CM | POA: Diagnosis not present

## 2022-07-14 DIAGNOSIS — Z124 Encounter for screening for malignant neoplasm of cervix: Secondary | ICD-10-CM | POA: Diagnosis present

## 2022-07-14 DIAGNOSIS — Z01419 Encounter for gynecological examination (general) (routine) without abnormal findings: Secondary | ICD-10-CM | POA: Diagnosis not present

## 2022-07-14 MED ORDER — NORETHIN ACE-ETH ESTRAD-FE 1-20 MG-MCG(24) PO TABS: 1.0000 | ORAL_TABLET | Freq: Every day | ORAL | 3 refills | Status: DC

## 2022-07-14 NOTE — Patient Instructions (Signed)
I value your feedback and you entrusting us with your care. If you get a [facility] patient survey, I would appreciate you taking the time to let us know about your experience today. Thank you! ? ? ?

## 2022-07-15 LAB — CYTOLOGY - PAP
Chlamydia: POSITIVE — AB
Comment: NEGATIVE
Comment: NORMAL
Diagnosis: NEGATIVE
Neisseria Gonorrhea: NEGATIVE

## 2022-07-16 MED ORDER — AZITHROMYCIN 500 MG PO TABS: 1000.0000 mg | ORAL_TABLET | Freq: Once | ORAL | 0 refills | Status: AC

## 2022-07-16 NOTE — Progress Notes (Signed)
Pls call pt Mon to do partner tx.

## 2022-07-16 NOTE — Addendum Note (Signed)
Addended by: Ardeth Perfect B on: 82/0/6015 10:10 PM   Modules accepted: Orders

## 2022-07-18 MED ORDER — AZITHROMYCIN 500 MG PO TABS: ORAL_TABLET | ORAL | 0 refills | Status: DC

## 2022-07-18 NOTE — Addendum Note (Signed)
Addended by: Drenda Freeze on: 07/18/2022 03:49 PM   Modules accepted: Orders

## 2022-08-23 IMAGING — US US THYROID
1 series · 14 of 25 positions shown · non-contrast
Comparison: None Available.

CLINICAL DATA: Goiter.

EXAM:
THYROID ULTRASOUND
TECHNIQUE: Ultrasound examination of the thyroid gland and adjacent soft
tissues was performed.

[Series 1: us thyroid · 14 of 26 slices shown]
[im 1/26]
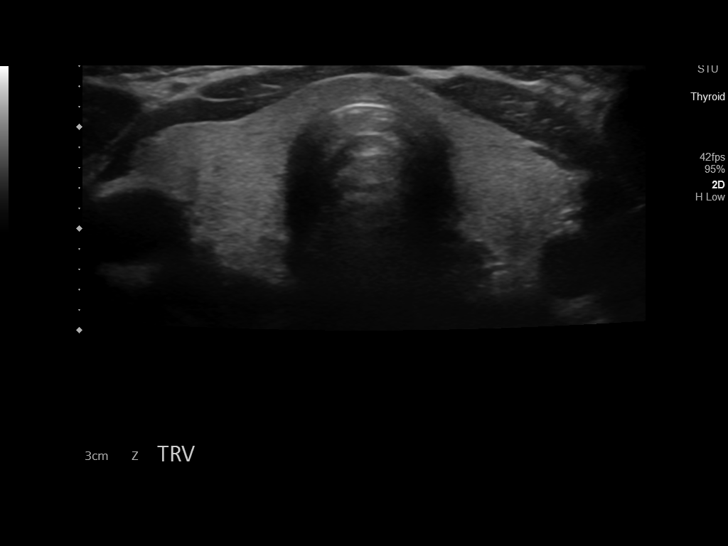
[im 3/26]
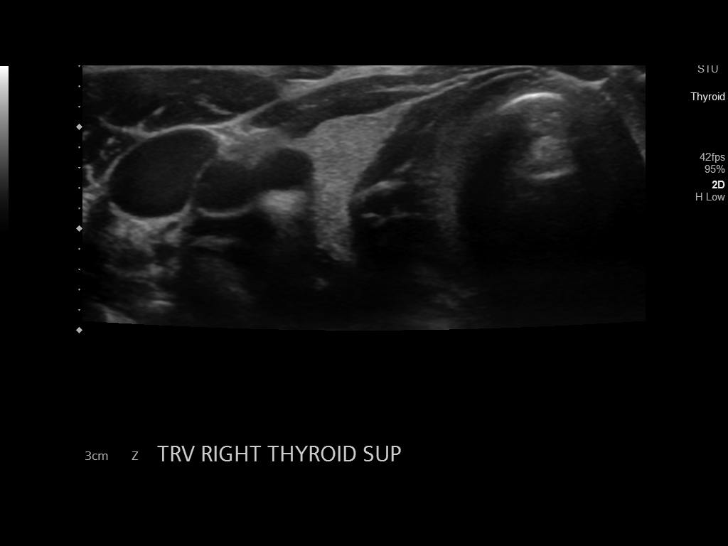
[im 5/26]
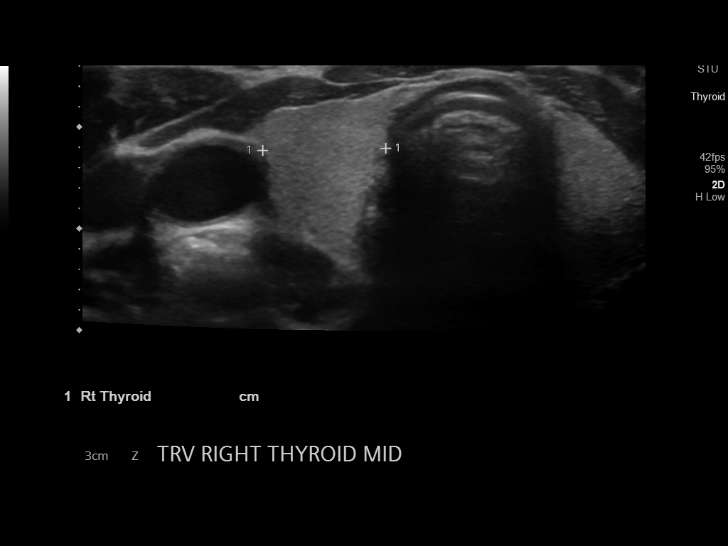
[im 7/26]
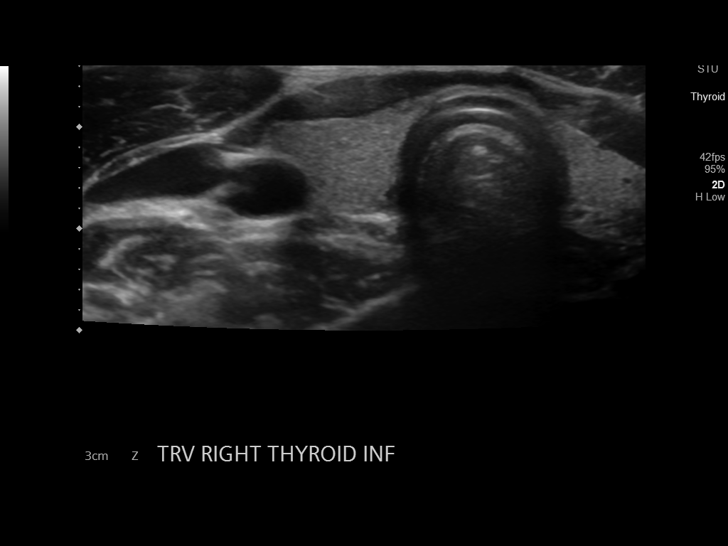
[im 9/26]
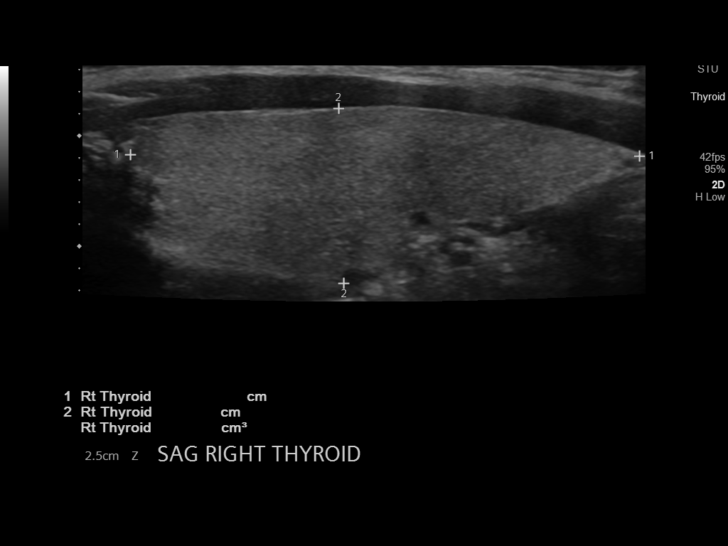
[im 10/26]
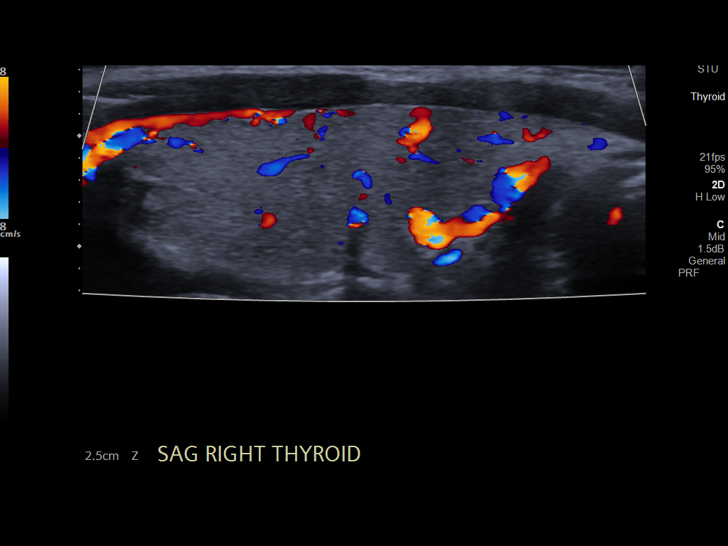
[im 12/26]
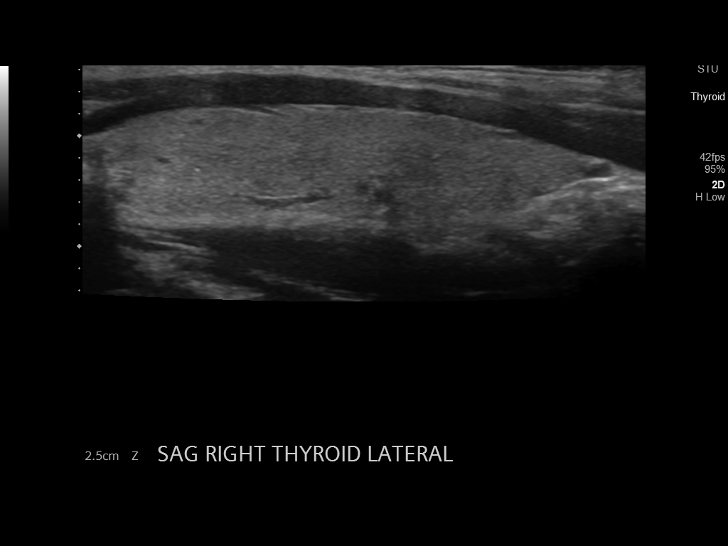
[im 14/26]
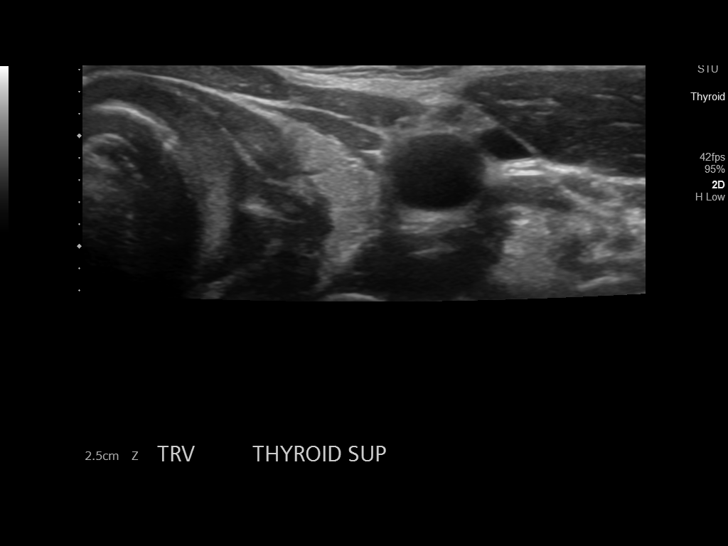
[im 16/26]
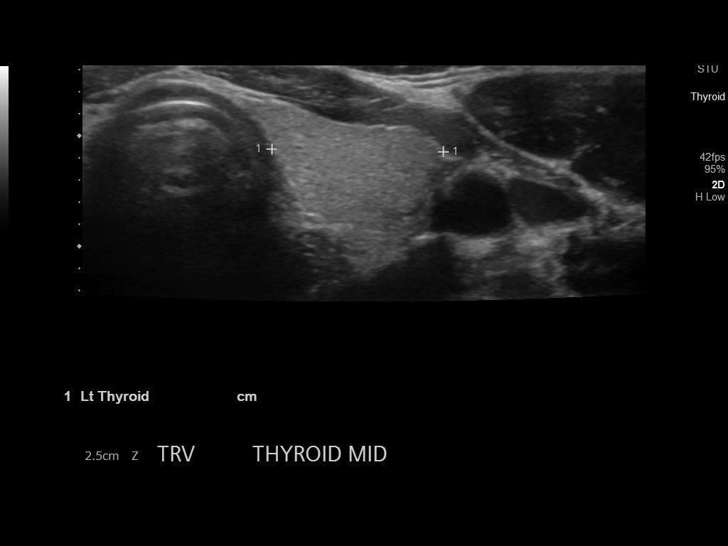
[im 17/26]
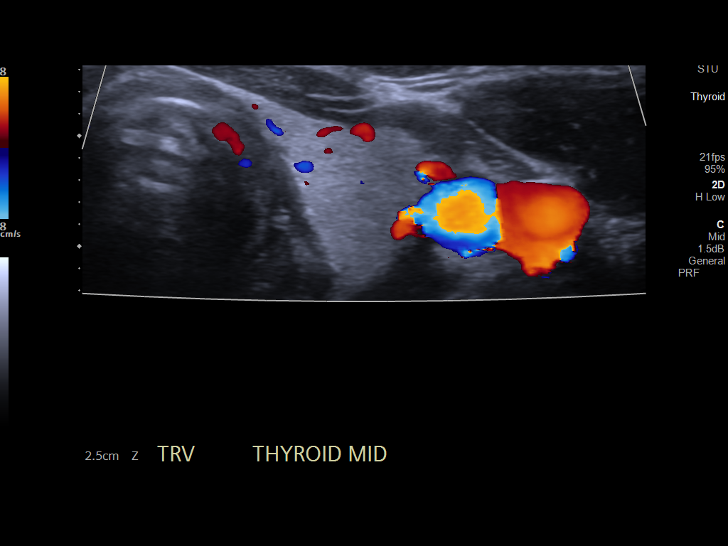
[im 19/26]
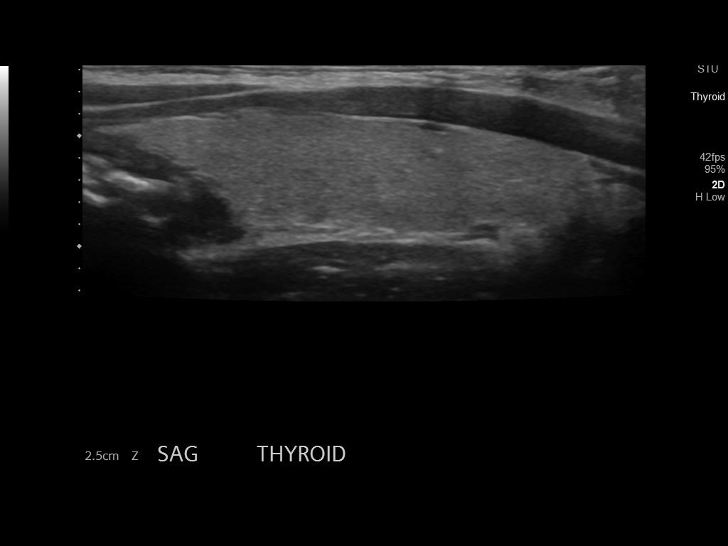
[im 21/26]
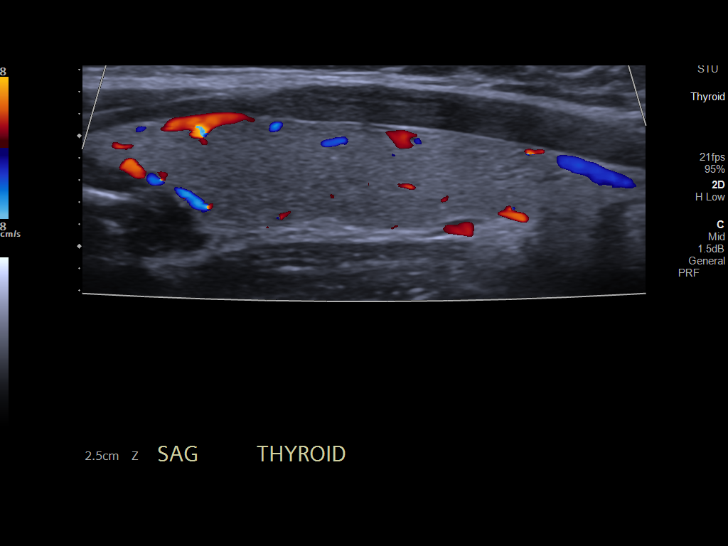
[im 23/26]
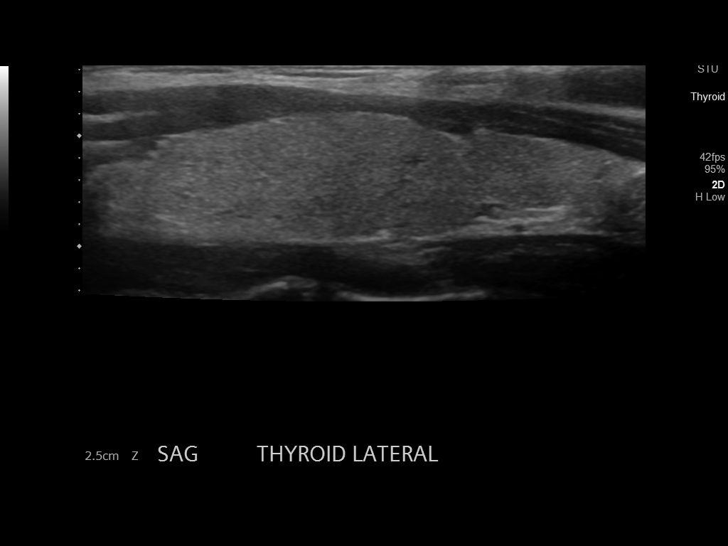
[im 26/26]
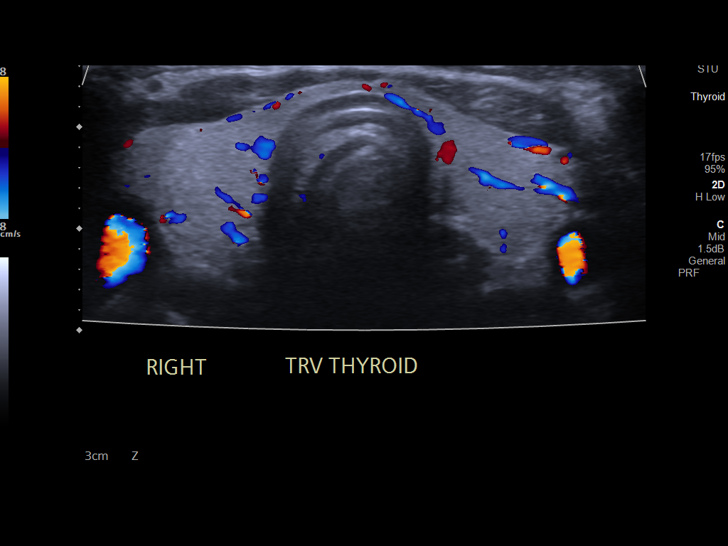

[14 of 25 positions shown; findings below may reference images not displayed]

FINDINGS: Parenchymal Echotexture: Normal

Isthmus: 0.3 cm

Right lobe: 4.6 x 1.6 x 1.2 cm

Left lobe: 4.4 x 1.1 x 1.6 cm

_________________________________________________________

Estimated total number of nodules >/= 1 cm: 0

Number of spongiform nodules >/=  2 cm not described below (TR1): 0

Number of mixed cystic and solid nodules >/= 1.5 cm not described
below (TR2): 0

_________________________________________________________

No discrete nodules are seen within the thyroid gland.
IMPRESSION: Normal size and appearance of the thyroid gland. No discrete thyroid
nodule.

## 2022-08-31 ENCOUNTER — Telehealth: Payer: Self-pay

## 2022-08-31 NOTE — Telephone Encounter (Signed)
Pt called triage requesting proof of her flu shot. Letter sent via mychart.

## 2023-02-20 ENCOUNTER — Ambulatory Visit: Payer: Medicaid Other | Admitting: Obstetrics and Gynecology

## 2023-07-25 ENCOUNTER — Ambulatory Visit: Payer: Medicaid Other | Admitting: Obstetrics and Gynecology

## 2023-08-28 NOTE — Progress Notes (Deleted)
PCP:  [facility]   No chief complaint on file.    HPI:      [name] is a 22 y.o. [id number] whose LMP was No LMP recorded., presents today for her NP annual examination.  Her menses are regular every 28-30 days, lasting 7 days, mod flow, no BTB, mild dysmen.   Sex activity: single partner, contraception - none. Interested in OCPs; didn't like depo, doesn't want nexplanon/IUD. No hx of HTN, DVTs, migraines with aura. On statin for hyperlipidemia.  Last Pap: 07/14/22 Results were normal Hx of STDs: chlamydia in past and 11/23, treated but never had TOC  There is no FH of breast cancer. There is no FH of ovarian cancer. The patient does not do self-breast exams.  Tobacco use: The patient denies current or previous tobacco use. Alcohol use: none No drug use.  Exercise: moderately active  She does get adequate calcium but not Vitamin D in her diet. Labs with PCP; being treated for hyperlipidemia  Patient Active Problem List   Diagnosis Date Noted   Depo-Provera contraceptive status 06/10/2019   Insomnia 02/21/2018   First degree heart block 11/02/2013   Palpitations 11/02/2013   CARDIAC MURMUR 11/29/2007    Past Surgical History:  Procedure Laterality Date   NO PAST SURGERIES      Family History  Problem Relation Age of Onset   Hyperlipidemia Mother    Hyperlipidemia Maternal Grandmother    COPD Maternal Grandmother    Heart disease Maternal Grandmother    Diabetes Paternal Grandfather     Social History   Socioeconomic History   Marital status: Single    Spouse name: Not on file   Number of children: Not on file   Years of education: Not on file   Highest education level: Not on file  Occupational History   Not on file  Tobacco Use   Smoking status: Never    Passive exposure: Yes   Smokeless tobacco: Never  Vaping Use   Vaping status: Never Used  Substance and Sexual Activity   Alcohol use: No   Drug use: No   Sexual activity: Yes     Partners: Male    Birth control/protection: None  Other Topics Concern   Not on file  Social History Narrative   Not on file   Social Drivers of Health   Financial Resource Strain: Not on file  Food Insecurity: Not on file  Transportation Needs: Not on file  Physical Activity: Not on file  Stress: Not on file  Social Connections: Not on file  Intimate Partner Violence: Not At Risk (05/12/2019)   Humiliation, Afraid, Rape, and Kick questionnaire    Fear of Current or Ex-Partner: No    Emotionally Abused: No    Physically Abused: No    Sexually Abused: No     Current Outpatient Medications:    atorvastatin (LIPITOR) 10 MG tablet, Take 10 mg by mouth daily., Disp: , Rfl:    azithromycin (ZITHROMAX) 500 MG tablet, Expedited Partner Therapy, Disp: 2 tablet, Rfl: 0   Norethindrone Acetate-Ethinyl Estrad-FE (LOESTRIN 24 FE) 1-20 MG-MCG(24) tablet, Take 1 tablet by mouth daily., Disp: 84 tablet, Rfl: 3     ROS:  Review of Systems  Constitutional:  Negative for fatigue, fever and unexpected weight change.  Respiratory:  Negative for cough, shortness of breath and wheezing.   Cardiovascular:  Negative for chest pain, palpitations and leg swelling.  Gastrointestinal:  Negative for blood in stool, constipation,  diarrhea, nausea and vomiting.  Endocrine: Negative for cold intolerance, heat intolerance and polyuria.  Genitourinary:  Negative for dyspareunia, dysuria, flank pain, frequency, genital sores, hematuria, menstrual problem, pelvic pain, urgency, vaginal bleeding, vaginal discharge and vaginal pain.  Musculoskeletal:  Negative for back pain, joint swelling and myalgias.  Skin:  Negative for rash.  Neurological:  Negative for dizziness, syncope, light-headedness, numbness and headaches.  Hematological:  Negative for adenopathy.  Psychiatric/Behavioral:  Negative for agitation, confusion, sleep disturbance and suicidal ideas. The patient is not nervous/anxious.    BREAST: No  symptoms   Objective: There were no vitals taken for this visit.   Physical Exam Constitutional:      Appearance: She is well-developed.  Genitourinary:     Vulva normal.     Right Labia: No rash, tenderness or lesions.    Left Labia: No tenderness, lesions or rash.    No vaginal discharge, erythema or tenderness.      Right Adnexa: not tender and no mass present.    Left Adnexa: not tender and no mass present.    No cervical friability or polyp.     Uterus is not enlarged or tender.  Breasts:    Right: No mass, nipple discharge, skin change or tenderness.     Left: No mass, nipple discharge, skin change or tenderness.  Neck:     Thyroid: No thyromegaly.  Cardiovascular:     Rate and Rhythm: Normal rate and regular rhythm.     Heart sounds: Normal heart sounds. No murmur heard. Pulmonary:     Effort: Pulmonary effort is normal.     Breath sounds: Normal breath sounds.  Abdominal:     Palpations: Abdomen is soft.     Tenderness: There is no abdominal tenderness. There is no guarding or rebound.  Musculoskeletal:        General: Normal range of motion.     Cervical back: Normal range of motion.  Lymphadenopathy:     Cervical: No cervical adenopathy.  Neurological:     General: No focal deficit present.     Mental Status: She is alert and oriented to person, place, and time.     Cranial Nerves: No cranial nerve deficit.  Skin:    General: Skin is warm and dry.  Psychiatric:        Mood and Affect: Mood normal.        Behavior: Behavior normal.        Thought Content: Thought content normal.        Judgment: Judgment normal.  Vitals reviewed.    Assessment/Plan: Encounter for annual routine gynecological examination  Cervical cancer screening - Plan: Cytology - PAP  Screening for STD (sexually transmitted disease) - Plan: Cytology - PAP  Encounter for initial prescription of contraceptive pills - Plan: Norethindrone Acetate-Ethinyl Estrad-FE (LOESTRIN 24 FE)  1-20 MG-MCG(24) tablet; OCP start with next menses, condoms for 1 mo. Rx eRxd  No orders of the defined types were placed in this encounter.           GYN counsel use and side effects of OCP's, adequate intake of calcium and vitamin D, diet and exercise      F/U  No follow-ups on file.  Traniyah Hallett B. Asalee Barrette, PA-C 08/28/2023 2:49 PM

## 2023-08-31 ENCOUNTER — Ambulatory Visit: Payer: Medicaid Other | Admitting: Obstetrics and Gynecology

## 2023-08-31 DIAGNOSIS — Z01419 Encounter for gynecological examination (general) (routine) without abnormal findings: Secondary | ICD-10-CM

## 2023-08-31 DIAGNOSIS — Z113 Encounter for screening for infections with a predominantly sexual mode of transmission: Secondary | ICD-10-CM

## 2023-08-31 DIAGNOSIS — Z3041 Encounter for surveillance of contraceptive pills: Secondary | ICD-10-CM

## 2023-08-31 DIAGNOSIS — A749 Chlamydial infection, unspecified: Secondary | ICD-10-CM

## 2023-08-31 NOTE — Progress Notes (Signed)
PCP:  [facility]   Chief Complaint  Patient presents with   Gynecologic Exam    No concerns     HPI:      [name] is a 22 y.o. [id number] whose LMP was Patient's last menstrual period was 08/25/2023 (exact date)., presents today for her annual examination.  Her menses are regular every 28-30 days, lasting 7 days, mod flow, no BTB, mild dysmen. Not on OCPs.   Sex activity: single partner, contraception - none, conception ok, not taking PNVs. Got pregnant quickly first times in past, not conceiving quickly this time. Not trying but not preventing. Same FOB.  Last Pap: 07/14/22 Results were normal Hx of STDs: chlamydia in past and 11/23, treated but never had TOC. Pt feels like she gets recurrent sx because partner doesn't do tx.   There is no FH of breast cancer. There is no FH of ovarian cancer. The patient does occas self-breast exams.  Tobacco use: The patient denies current or previous tobacco use. Alcohol use: none No drug use.  Exercise: moderately active  She does get adequate calcium but not Vitamin D in her diet. Labs with PCP; being treated for hyperlipidemia  Patient Active Problem List   Diagnosis Date Noted   Depo-Provera contraceptive status 06/10/2019   Insomnia 02/21/2018   First degree heart block 11/02/2013   Palpitations 11/02/2013   CARDIAC MURMUR 11/29/2007    Past Surgical History:  Procedure Laterality Date   NO PAST SURGERIES      Family History  Problem Relation Age of Onset   Hyperlipidemia Mother    Hyperlipidemia Maternal Grandmother    COPD Maternal Grandmother    Heart disease Maternal Grandmother    Diabetes Paternal Grandfather     Social History   Socioeconomic History   Marital status: Single    Spouse name: Not on file   Number of children: Not on file   Years of education: Not on file   Highest education level: Not on file  Occupational History   Not on file  Tobacco Use   Smoking status: Never     Passive exposure: Yes   Smokeless tobacco: Never  Vaping Use   Vaping status: Never Used  Substance and Sexual Activity   Alcohol use: No   Drug use: No   Sexual activity: Yes    Partners: Male    Birth control/protection: None  Other Topics Concern   Not on file  Social History Narrative   Not on file   Social Drivers of Health   Financial Resource Strain: Not on file  Food Insecurity: Not on file  Transportation Needs: Not on file  Physical Activity: Not on file  Stress: Not on file  Social Connections: Not on file  Intimate Partner Violence: Not At Risk (05/12/2019)   Humiliation, Afraid, Rape, and Kick questionnaire    Fear of Current or Ex-Partner: No    Emotionally Abused: No    Physically Abused: No    Sexually Abused: No     Current Outpatient Medications:    atorvastatin (LIPITOR) 20 MG tablet, Take 20 mg by mouth daily., Disp: , Rfl:      ROS:  Review of Systems  Constitutional:  Negative for fatigue, fever and unexpected weight change.  Respiratory:  Negative for cough, shortness of breath and wheezing.   Cardiovascular:  Negative for chest pain, palpitations and leg swelling.  Gastrointestinal:  Negative for blood in stool, constipation, diarrhea, nausea and vomiting.  Endocrine: Negative for cold intolerance, heat intolerance and polyuria.  Genitourinary:  Negative for dyspareunia, dysuria, flank pain, frequency, genital sores, hematuria, menstrual problem, pelvic pain, urgency, vaginal bleeding, vaginal discharge and vaginal pain.  Musculoskeletal:  Negative for back pain, joint swelling and myalgias.  Skin:  Negative for rash.  Neurological:  Negative for dizziness, syncope, light-headedness, numbness and headaches.  Hematological:  Negative for adenopathy.  Psychiatric/Behavioral:  Negative for agitation, confusion, sleep disturbance and suicidal ideas. The patient is not nervous/anxious.    BREAST: No symptoms   Objective: BP 118/73   Pulse 62    Ht 5\' 4"  (1.626 m)   Wt 170 lb (77.1 kg)   LMP 08/25/2023 (Exact Date)   BMI 29.18 kg/m    Physical Exam Constitutional:      Appearance: She is well-developed.  Genitourinary:     Vulva normal.     Right Labia: No rash, tenderness or lesions.    Left Labia: No tenderness, lesions or rash.    No vaginal discharge, erythema or tenderness.      Right Adnexa: not tender and no mass present.    Left Adnexa: not tender and no mass present.    No cervical friability or polyp.     Uterus is not enlarged or tender.  Breasts:    Right: No mass, nipple discharge, skin change or tenderness.     Left: No mass, nipple discharge, skin change or tenderness.  Neck:     Thyroid: No thyromegaly.  Cardiovascular:     Rate and Rhythm: Normal rate and regular rhythm.     Heart sounds: Normal heart sounds. No murmur heard. Pulmonary:     Effort: Pulmonary effort is normal.     Breath sounds: Normal breath sounds.  Abdominal:     Palpations: Abdomen is soft.     Tenderness: There is no abdominal tenderness. There is no guarding or rebound.  Musculoskeletal:        General: Normal range of motion.     Cervical back: Normal range of motion.  Lymphadenopathy:     Cervical: No cervical adenopathy.  Neurological:     General: No focal deficit present.     Mental Status: She is alert and oriented to person, place, and time.     Cranial Nerves: No cranial nerve deficit.  Skin:    General: Skin is warm and dry.  Psychiatric:        Mood and Affect: Mood normal.        Behavior: Behavior normal.        Thought Content: Thought content normal.        Judgment: Judgment normal.  Vitals reviewed.    Assessment/Plan: Encounter for annual routine gynecological examination  Screening for STD (sexually transmitted disease) - Plan: Cervicovaginal ancillary only  Chlamydia - Plan: Cervicovaginal ancillary only; TOC today. Discussed importance of partner doing tx if she is positive and treated.  Will f/u with results.   Pre-conception counseling--start PNVs, f/u prn NOB. D/C lipitor with pos UPT.            GYN counsel adequate intake of calcium and vitamin D, diet and exercise      F/U  Return in about 1 year (around 08/31/2024).  Julie Gundry B. Nashalie Sallis, PA-C 09/01/2023 9:54 AM

## 2023-09-01 ENCOUNTER — Encounter: Payer: Self-pay | Admitting: Obstetrics and Gynecology

## 2023-09-01 ENCOUNTER — Ambulatory Visit (INDEPENDENT_AMBULATORY_CARE_PROVIDER_SITE_OTHER): Payer: Medicaid Other | Admitting: Obstetrics and Gynecology

## 2023-09-01 ENCOUNTER — Other Ambulatory Visit (HOSPITAL_COMMUNITY)
Admission: RE | Admit: 2023-09-01 | Discharge: 2023-09-01 | Disposition: A | Discharge status: Home / Self Care | Payer: Medicaid Other | Source: Ambulatory Visit | Attending: Obstetrics and Gynecology | Admitting: Obstetrics and Gynecology

## 2023-09-01 VITALS — BP 118/73 | HR 62 | Ht 64.0 in | Wt 170.0 lb

## 2023-09-01 DIAGNOSIS — Z3041 Encounter for surveillance of contraceptive pills: Secondary | ICD-10-CM

## 2023-09-01 DIAGNOSIS — A749 Chlamydial infection, unspecified: Secondary | ICD-10-CM

## 2023-09-01 DIAGNOSIS — Z3169 Encounter for other general counseling and advice on procreation: Secondary | ICD-10-CM

## 2023-09-01 DIAGNOSIS — Z01419 Encounter for gynecological examination (general) (routine) without abnormal findings: Secondary | ICD-10-CM

## 2023-09-01 DIAGNOSIS — Z113 Encounter for screening for infections with a predominantly sexual mode of transmission: Secondary | ICD-10-CM

## 2023-09-01 NOTE — Patient Instructions (Signed)
I value your feedback and you entrusting us with your care. If you get a [facility] patient survey, I would appreciate you taking the time to let us know about your experience today. Thank you! ? ? ?

## 2023-09-05 LAB — CERVICOVAGINAL ANCILLARY ONLY
Chlamydia: POSITIVE — AB
Comment: NEGATIVE
Comment: NEGATIVE
Comment: NORMAL
Neisseria Gonorrhea: NEGATIVE
Trichomonas: NEGATIVE

## 2023-09-07 MED ORDER — AZITHROMYCIN 500 MG PO TABS: 1000.0000 mg | ORAL_TABLET | Freq: Once | ORAL | 0 refills | Status: AC

## 2023-09-07 NOTE — Progress Notes (Signed)
Pls notify [facility] and make sure pt sees message from me. Thx.

## 2023-09-07 NOTE — Addendum Note (Signed)
Addended by: Althea Grimmer B on: 09/07/2023 10:27 AM   Modules accepted: Orders

## 2023-09-25 ENCOUNTER — Encounter: Payer: Self-pay | Admitting: Obstetrics and Gynecology

## 2023-09-25 DIAGNOSIS — A749 Chlamydial infection, unspecified: Secondary | ICD-10-CM

## 2023-09-25 MED ORDER — AZITHROMYCIN 500 MG PO TABS: 1000.0000 mg | ORAL_TABLET | Freq: Every day | ORAL | 0 refills | Status: DC

## 2023-09-25 NOTE — Addendum Note (Signed)
 Addended by: Kathlene Cote on: 09/25/2023 04:40 PM   Modules accepted: Orders

## 2023-10-30 ENCOUNTER — Encounter: Payer: Self-pay | Admitting: Obstetrics and Gynecology

## 2023-10-31 ENCOUNTER — Ambulatory Visit: Payer: Medicaid Other

## 2023-10-31 ENCOUNTER — Other Ambulatory Visit (HOSPITAL_COMMUNITY)
Admission: RE | Admit: 2023-10-31 | Discharge: 2023-10-31 | Disposition: A | Discharge status: Home / Self Care | Payer: Medicaid Other | Source: Ambulatory Visit | Attending: Obstetrics and Gynecology | Admitting: Obstetrics and Gynecology

## 2023-10-31 VITALS — BP 130/65 | HR 58 | Ht 64.0 in | Wt 174.0 lb

## 2023-10-31 DIAGNOSIS — N898 Other specified noninflammatory disorders of vagina: Secondary | ICD-10-CM

## 2023-10-31 DIAGNOSIS — Z113 Encounter for screening for infections with a predominantly sexual mode of transmission: Secondary | ICD-10-CM | POA: Insufficient documentation

## 2023-10-31 NOTE — Progress Notes (Signed)
    NURSE VISIT NOTE  Subjective:    Patient ID: Julie Potter, female    DOB: 08-24-2001, 23 y.o.   MRN: [id number]  HPI  Patient is a 23 y.o. [id number] female who presents for clear vaginal discharge for 5 day(s). Denies abnormal vaginal bleeding or significant pelvic pain or fever. denies dysuria, hematuria, urinary frequency, urinary urgency, flank pain, abdominal pain, pelvic pain, cloudy malordorous urine, genital rash, and genital irritation. Patient has history of known exposure to STD.   Objective:    BP 130/65   Pulse (!) 58   Ht 5\' 4"  (1.626 m)   Wt 174 lb (78.9 kg)   LMP 09/27/2023   BMI 29.87 kg/m    @THIS  VISIT ONLY@  Assessment:   1. Screening examination for STI   2. Vaginal odor   3. Vaginal discharge       Plan:   GC and chlamydia DNA  probe sent to lab. Treatment: abstain from coitus during course of treatment ROV prn if symptoms persist or worsen.   Cornelius Moras, CMA

## 2023-11-01 ENCOUNTER — Other Ambulatory Visit: Payer: Self-pay

## 2023-11-01 DIAGNOSIS — N760 Acute vaginitis: Secondary | ICD-10-CM

## 2023-11-01 LAB — CERVICOVAGINAL ANCILLARY ONLY
Bacterial Vaginitis (gardnerella): POSITIVE — AB
Candida Glabrata: NEGATIVE
Candida Vaginitis: NEGATIVE
Chlamydia: NEGATIVE
Comment: NEGATIVE
Comment: NEGATIVE
Comment: NEGATIVE
Comment: NEGATIVE
Comment: NEGATIVE
Comment: NORMAL
Neisseria Gonorrhea: NEGATIVE
Trichomonas: NEGATIVE

## 2023-11-01 MED ORDER — METRONIDAZOLE 500 MG PO TABS: 500.0000 mg | ORAL_TABLET | Freq: Two times a day (BID) | ORAL | 0 refills | Status: DC

## 2023-11-20 ENCOUNTER — Ambulatory Visit (INDEPENDENT_AMBULATORY_CARE_PROVIDER_SITE_OTHER): Admitting: Family Medicine

## 2023-11-20 ENCOUNTER — Encounter: Payer: Self-pay | Admitting: Family Medicine

## 2023-11-20 VITALS — BP 131/77 | HR 54 | Temp 98.0°F | Ht 63.0 in | Wt 178.4 lb

## 2023-11-20 DIAGNOSIS — Z7689 Persons encountering health services in other specified circumstances: Secondary | ICD-10-CM | POA: Diagnosis not present

## 2023-11-20 DIAGNOSIS — R011 Cardiac murmur, unspecified: Secondary | ICD-10-CM

## 2023-11-20 NOTE — Patient Instructions (Signed)

## 2023-11-20 NOTE — Progress Notes (Signed)
 New Patient Office Visit  Subjective   Patient ID: Julie Potter, female    DOB: 08-27-2001  Age: 23 y.o. MRN: [id number]  CC:  Chief Complaint  Patient presents with   Heart health    Pt. Wants to talk about her heart murmer.     HPI Julie Potter is a 23 year old female who presents to establish with [facility] at [facility]   CC: Patient here to establish care  Last PCP: Dr. Cheri Kearns Specialists: OBGYN, GYN exam in 08/2023   She has concerns today about: her heart murmur  "I just remember being younger and it being an issue for me. I remember having to take medication before the dentist and having to wear a heart monitor." She reports that she thinks she was born with this murmur- was told it was maybe a "hole in her heart." She reports that she will occasionally only experience an intermittent, brief sharp and stabbing pain in her right upper chest, rating it a 6 out of 10.  She reports that "it is not once a week or twice a week, but enough for me to notice." She does note feeling short of breath- does not notice it with activity or exertion. "It feels heavy for me." Feels like she does not have circulation in her legs. She does notice the back of her legs are hairless. Reports she has headaches all the time- pounding throbbing headaches. Denies palpitations, changes in vision, lower extremity edema, lightheadedness, weakness, cough.   Symptoms Fever/chills: no  Nausea/vomiting: no  Diaphoresis: no  Shortness of breath: yes, occasionally at rest   Cough: no  Edema: yes, sometimes. She does wear compression socks.   Orthopnea: no  PND: no  Dizziness: yes, after exertion (when she gets off the treadmill or stands up too fast)   Palpitations: no  Syncope: no  Indigestion: no   Red Flags Worse with exertion: no  Recent Immobility: no  Cancer history: no  Tearing/radiation to back: no   The ASCVD Risk score (Arnett DK, et al., 2019) failed to  calculate for the following reasons:   The 2019 ASCVD risk score is only valid for ages 51 to 48    Outpatient Encounter Medications as of 11/20/2023  Medication Sig   atorvastatin (LIPITOR) 20 MG tablet Take 20 mg by mouth daily. (Patient not taking: Reported on 11/20/2023)   azithromycin (ZITHROMAX) 500 MG tablet Take 2 tablets (1,000 mg total) by mouth daily. (Patient not taking: Reported on 11/20/2023)   metroNIDAZOLE (FLAGYL) 500 MG tablet Take 1 tablet (500 mg total) by mouth 2 (two) times daily. (Patient not taking: Reported on 11/20/2023)   No facility-administered encounter medications on file as of 11/20/2023.    Patient Active Problem List   Diagnosis Date Noted   Depo-Provera contraceptive status 06/10/2019   Insomnia 02/21/2018   First degree heart block 11/02/2013   Palpitations 11/02/2013   CARDIAC MURMUR 11/29/2007   Past Medical History:  Diagnosis Date   Anemia    Heart murmur    Past Surgical History:  Procedure Laterality Date   NO PAST SURGERIES     Family History  Problem Relation Age of Onset   Hyperlipidemia Mother    Hyperlipidemia Maternal Grandmother    COPD Maternal Grandmother    Heart disease Maternal Grandmother    Diabetes Paternal Grandfather    Social History   Socioeconomic History   Marital status: Single    Spouse name: Not  on file   Number of children: Not on file   Years of education: Not on file   Highest education level: Not on file  Occupational History   Not on file  Tobacco Use   Smoking status: Never    Passive exposure: Yes   Smokeless tobacco: Never  Vaping Use   Vaping status: Never Used  Substance and Sexual Activity   Alcohol use: No   Drug use: No   Sexual activity: Yes    Partners: Male    Birth control/protection: None  Other Topics Concern   Not on file  Social History Narrative   Not on file   Social Drivers of Health   Financial Resource Strain: Not on file  Food Insecurity: Not on file   Transportation Needs: Not on file  Physical Activity: Not on file  Stress: Not on file  Social Connections: Not on file  Intimate Partner Violence: Not At Risk (05/12/2019)   Humiliation, Afraid, Rape, and Kick questionnaire    Fear of Current or Ex-Partner: No    Emotionally Abused: No    Physically Abused: No    Sexually Abused: No   Outpatient Medications Prior to Visit  Medication Sig Dispense Refill   atorvastatin (LIPITOR) 20 MG tablet Take 20 mg by mouth daily. (Patient not taking: Reported on 11/20/2023)     azithromycin (ZITHROMAX) 500 MG tablet Take 2 tablets (1,000 mg total) by mouth daily. (Patient not taking: Reported on 11/20/2023) 2 tablet 0   metroNIDAZOLE (FLAGYL) 500 MG tablet Take 1 tablet (500 mg total) by mouth 2 (two) times daily. (Patient not taking: Reported on 11/20/2023) 14 tablet 0   No facility-administered medications prior to visit.   No Known Allergies  ROS: see HPI   Objective   Today's Vitals   11/20/23 0857  BP: 131/77  Pulse: (!) 54  Temp: 98 F (36.7 C)  TempSrc: Oral  SpO2: 98%  Weight: 178 lb 6.4 oz (80.9 kg)  Height: 5\' 3"  (1.6 m)  PainSc: 0-No pain   Physical Exam Vitals reviewed.  Constitutional:      Appearance: Normal appearance.  Cardiovascular:     Rate and Rhythm: Regular rhythm. Bradycardia present.     Pulses: Normal pulses.          Radial pulses are 2+ on the right side and 2+ on the left side.       Dorsalis pedis pulses are 2+ on the right side and 2+ on the left side.       Posterior tibial pulses are 2+ on the right side and 2+ on the left side.     Heart sounds: Murmur heard.     Systolic murmur is present with a grade of 1/6.  Pulmonary:     Effort: Pulmonary effort is normal. No accessory muscle usage or respiratory distress.     Breath sounds: Normal breath sounds.  Musculoskeletal:     Right lower leg: No edema.     Left lower leg: No edema.  Neurological:     Mental Status: She is alert.  Psychiatric:         Mood and Affect: Mood normal.        Behavior: Behavior normal.      Assessment & Plan:   1. Encounter to establish care (Primary) Patient is a 61- year-old female who presents today to establish care with primary care at [facility]. Reviewed the past medical history, family history, social history, surgical history, medications and  allergies today- updates made as indicated. Patient has concerns today about history of heart murmur.   2. 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  Patient is in no acute distress and is well-appearing.  Patient is able to answer all questions appropriately and speak in full sentences without experiencing any respiratory distress.  Reports intermittent sharp right-sided upper chest pain that has been occurring more frequently. Denies shortness of breath, lower extremity edema, vision changes, headaches, palpitations, fatigue, and lower extremity edema. Vital signs with normal blood pressure, adequate oxygen saturation and slight bradycardia. Cardiovascular exam with mild, mid-systolic grade 1/5 murmur present. No pitting lower extremity edema present. Lungs clear to auscultation bilaterally. Radial, DP and PT pulses 2+. Skin warm, dry without discoloration, erythema, edema, bruising and wounds present. Unable to perform EKG in office due to equipment malfunction. Patient is in no acute distress today that would warrant an urgent EKG to be performed. Due to new symptoms and history of cardiac murmur, will refer to cardiology.  - Ambulatory referral to Cardiology   Return in about 6 weeks (around 01/01/2024) for heart murmur- cards f/u.   Alyson Reedy, FNP

## 2023-11-21 NOTE — Progress Notes (Addendum)
 Cardiology Office Note:  .   Date:  11/23/2023  ID:  Julie Potter, DOB Feb 08, 2001, MRN [id number] PCP: Wilhelmena Hanson, FNP  [facility] Providers Cardiologist:  None {    History of Present Illness: .   Julie Potter is a 23 y.o. 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  The patient reports heart murmur dating back to age 25-10. Says she wore a heart monitor for a week and had an echo. Care Everywhere shows work-up was at [facility] for pre-syncope/syncope in the setting of murmur. Unable to see results, but notes say EKG showed 1st degree AV block,  echo was normal with no defects, suspected innocent flow murmur. Unable to see echo report or images, unsure what type or where is the murmur.  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   No alcohol, drug, smoking history.   She also reports rare sharp chest pain that is brief and rare episodes of SOB. She says she used to take medication for high cholesterol, although she stopped medication because she wanted to work on lifestyle changes. She has been walking about 30 minutes daily. No chest pain or Sob with this. No lower leg edema, orthopnea, or pnd.    Studies Reviewed: Aaron Aas   EKG Interpretation Date/Time:  Thursday November 23 2023 09:10:08 EDT Ventricular Rate:  61 PR Interval:  250 QRS Duration:  90 QT Interval:  402 QTC Calculation: 404 R Axis:   46  Text Interpretation: Sinus rhythm with 1st degree A-V block When compared with ECG of 08-Aug-2014 23:14, PREVIOUS ECG IS PRESENT Confirmed by Gennaro Khat, Earnest Thalman ([id number]) on 11/23/2023 9:19:40 AM        Physical Exam:   VS:  BP 114/78   Pulse 61   Ht 5\' 4"  (1.626 m)   Wt 175 lb 12.8 oz (79.7 kg)   LMP 10/02/2023   SpO2 98%   BMI 30.18 kg/m    Wt Readings from Last 3 Encounters:  11/23/23 175 lb 12.8 oz (79.7 kg)  11/20/23 178 lb 6.4 oz (80.9 kg)  10/31/23 174 lb (78.9 kg)     GEN: Well nourished, well developed in no acute distress NECK: No JVD; No carotid bruits CARDIAC: RRR, 2/6 systolic murmur, no rubs, gallops RESPIRATORY:  Clear to auscultation without rales, wheezing or rhonchi  ABDOMEN: Soft, non-tender, non-distended EXTREMITIES:  No edema; No deformity   ASSESSMENT AND PLAN: .    Murmur She has h/o murmur as a child, suspected innocent flow murmur. Notes say echo was normal at that time, however unable to see report or what type or where is the murmur. She has murmur on exam, unsure if this is different. She reports new chest pain and SOB. EKG also shows NSR with 1st degree heart block, which is not new, but unable to see prior EKG and exact PRI. Unsure if PRI has worsened. Due to h/o murmur, chest pain/sob, and 1st degree on EKG I will update an echocardiogram.   Atypical chest pain She reports sharp chest pain that is very rare. She reports she does daily activity/exercise with no exertional symptoms. EKG shows  NSR, 1st degree AV block with NSR and no ischemic changes. Overall low suspicion for ACS. Start with echo as above.   H/o HLD I will repeat labs today. She was previously on Lipitor.   1st degree heart block Notes report 1st degree heart  block in 2015, but unable to see the EKG and how long this was. EKG today shows NSR, 61bpm PRI 250mg , no ischemic changes. I will check CMET and CBC.  Dispo: Follow-up in 1 month  Signed, Jenell Dobransky Rebekah Canada, PA-C

## 2023-11-23 ENCOUNTER — Encounter: Payer: Self-pay | Admitting: Medical

## 2023-11-23 ENCOUNTER — Ambulatory Visit: Attending: Medical | Admitting: Medical

## 2023-11-23 VITALS — BP 114/78 | HR 61 | Ht 64.0 in | Wt 175.8 lb

## 2023-11-23 DIAGNOSIS — E782 Mixed hyperlipidemia: Secondary | ICD-10-CM | POA: Diagnosis not present

## 2023-11-23 DIAGNOSIS — R0789 Other chest pain: Secondary | ICD-10-CM

## 2023-11-23 DIAGNOSIS — I440 Atrioventricular block, first degree: Secondary | ICD-10-CM | POA: Diagnosis not present

## 2023-11-23 DIAGNOSIS — Z79899 Other long term (current) drug therapy: Secondary | ICD-10-CM

## 2023-11-23 DIAGNOSIS — R011 Cardiac murmur, unspecified: Secondary | ICD-10-CM

## 2023-11-23 NOTE — Patient Instructions (Signed)
 Medication Instructions:  Your Physician recommend you continue on your current medication as directed.    *If you need a refill on your cardiac medications before your next appointment, please call your pharmacy*   Lab Work: Your provider would like for you to have following labs drawn today Lipid panel, CMeT, and CBC.   If you have labs (blood work) drawn today and your tests are completely normal, you will receive your results only by: MyChart Message (if you have MyChart) OR A paper copy in the mail If you have any lab test that is abnormal or we need to change your treatment, we will call you to review the results.   Testing/Procedures: Your physician has requested that you have an echocardiogram. Echocardiography is a painless test that uses sound waves to create images of your heart. It provides your doctor with information about the size and shape of your heart and how well your heart's chambers and valves are working.   You may receive an ultrasound enhancing agent through an IV if needed to better visualize your heart during the echo. This procedure takes approximately one hour.  There are no restrictions for this procedure.  This will take place at [address] [facility] [address], [place] [address]  Please note: We ask at that you not bring children with you during ultrasound (echo/ vascular) testing. Due to room size and safety concerns, children are not allowed in the ultrasound rooms during exams. Our front office staff cannot provide observation of children in our lobby area while testing is being conducted. An adult accompanying a patient to their appointment will only be allowed in the ultrasound room at the discretion of the ultrasound technician under special circumstances. We apologize for any inconvenience.   Follow-Up: At [facility], you and your health needs are our priority.  As part of our continuing mission to provide you with exceptional  heart care, we have created designated Provider Care Teams.  These Care Teams include your primary Cardiologist (physician) and Advanced Practice Providers (APPs -  Physician Assistants and Nurse Practitioners) who all work together to provide you with the care you need, when you need it.   Your next appointment:   1 month(s)  Provider:   You may see Cadence Fransico Michael, PA-C

## 2023-11-28 LAB — COMPREHENSIVE METABOLIC PANEL
ALT: 11 IU/L (ref 0–32)
AST: 18 IU/L (ref 0–40)
Albumin: 4 g/dL (ref 4.0–5.0)
Alkaline Phosphatase: 60 IU/L (ref 44–121)
BUN/Creatinine Ratio: 26 — ABNORMAL HIGH (ref 9–23)
BUN: 24 mg/dL — ABNORMAL HIGH (ref 6–20)
Bilirubin Total: <0.2 mg/dL (ref 0.0–1.2)
CO2: 19 mmol/L — ABNORMAL LOW (ref 20–29)
Calcium: 9.1 mg/dL (ref 8.7–10.2)
Chloride: 102 mmol/L (ref 96–106)
Creatinine, Ser: 0.93 mg/dL (ref 0.57–1.00)
Globulin, Total: 2.4 g/dL (ref 1.5–4.5)
Glucose: 86 mg/dL (ref 70–99)
Potassium: 4.7 mmol/L (ref 3.5–5.2)
Sodium: 138 mmol/L (ref 134–144)
Total Protein: 6.4 g/dL (ref 6.0–8.5)
eGFR: 88 mL/min/{1.73_m2} (ref >59)

## 2023-11-28 LAB — CBC
Hematocrit: 37.5 % (ref 34.0–46.6)
Hemoglobin: 12.6 g/dL (ref 11.1–15.9)
MCH: 29.4 pg (ref 26.6–33.0)
MCHC: 33.6 g/dL (ref 31.5–35.7)
MCV: 87 fL (ref 79–97)
Platelets: 284 10*3/uL (ref 150–450)
RBC: 4.29 x10E6/uL (ref 3.77–5.28)
RDW: 11.9 % (ref 11.7–15.4)
WBC: 7.2 10*3/uL (ref 3.4–10.8)

## 2023-11-28 LAB — LIPID PANEL
Chol/HDL Ratio: 7.6 ratio — ABNORMAL HIGH (ref 0.0–4.4)
Cholesterol, Total: 386 mg/dL — ABNORMAL HIGH (ref 100–199)
HDL: 51 mg/dL (ref >39)
LDL Chol Calc (NIH): 317 mg/dL — ABNORMAL HIGH (ref 0–99)
Triglycerides: 105 mg/dL (ref 0–149)
VLDL Cholesterol Cal: 18 mg/dL (ref 5–40)

## 2023-11-29 ENCOUNTER — Other Ambulatory Visit: Payer: Self-pay

## 2023-11-29 DIAGNOSIS — Z79899 Other long term (current) drug therapy: Secondary | ICD-10-CM

## 2023-12-12 LAB — LIPID PANEL
Chol/HDL Ratio: 9.7 ratio — ABNORMAL HIGH (ref 0.0–4.4)
Cholesterol, Total: 380 mg/dL — ABNORMAL HIGH (ref 100–199)
HDL: 39 mg/dL — ABNORMAL LOW (ref >39)
LDL Chol Calc (NIH): 300 mg/dL — ABNORMAL HIGH (ref 0–99)
Triglycerides: 194 mg/dL — ABNORMAL HIGH (ref 0–149)
VLDL Cholesterol Cal: 41 mg/dL — ABNORMAL HIGH (ref 5–40)

## 2023-12-13 ENCOUNTER — Telehealth: Payer: Self-pay | Admitting: Medical

## 2023-12-13 ENCOUNTER — Other Ambulatory Visit: Payer: Self-pay

## 2023-12-13 DIAGNOSIS — E782 Mixed hyperlipidemia: Secondary | ICD-10-CM

## 2023-12-13 NOTE — Telephone Encounter (Signed)
 Called patient with results of LIPID.   Advised LIPID referral was recommended, patient okay to be seen- referral placed.   Patient verbalized understanding.   Alisha- just FYI I called her!  Thanks!

## 2023-12-13 NOTE — Telephone Encounter (Signed)
Pt returning call in regards to results. Please advise 

## 2023-12-19 ENCOUNTER — Ambulatory Visit

## 2024-01-01 ENCOUNTER — Telehealth: Payer: Self-pay

## 2024-01-01 ENCOUNTER — Ambulatory Visit: Admitting: Family Medicine

## 2024-01-01 NOTE — Telephone Encounter (Signed)
 Left VM requesting a return call in order to reschedule appointment. Provider out of office.

## 2024-01-03 ENCOUNTER — Ambulatory Visit: Admitting: Medical

## 2024-01-10 ENCOUNTER — Other Ambulatory Visit (HOSPITAL_COMMUNITY)
Admission: RE | Admit: 2024-01-10 | Discharge: 2024-01-10 | Disposition: A | Discharge status: Home / Self Care | Source: Ambulatory Visit | Attending: Obstetrics and Gynecology | Admitting: Obstetrics and Gynecology

## 2024-01-10 ENCOUNTER — Ambulatory Visit (INDEPENDENT_AMBULATORY_CARE_PROVIDER_SITE_OTHER)

## 2024-01-10 VITALS — BP 138/83 | HR 58 | Wt 174.1 lb

## 2024-01-10 DIAGNOSIS — N898 Other specified noninflammatory disorders of vagina: Secondary | ICD-10-CM | POA: Diagnosis present

## 2024-01-10 NOTE — Progress Notes (Signed)
    NURSE VISIT NOTE  Subjective:    Patient ID: Julie Potter, female    DOB: 11/28/2000, 23 y.o.   MRN: [id number]  HPI  Patient is a 23 y.o. G28P1021 female who presents for white vaginal discharge with a little odor for 2 month(s). Denies abnormal vaginal bleeding or significant pelvic pain or fever. Patient has history of known exposure to STD.   Objective:    BP 138/83 (BP Location: Right Arm, Patient Position: Sitting, Cuff Size: Normal)   Pulse (!) 58   Wt 174 lb 1.6 oz (79 kg)   BMI 29.88 kg/m      Assessment:   1. Vaginal discharge       Plan:   GC and chlamydia DNA  probe sent to lab. Treatment: waiting on results  ROV prn if symptoms persist or worsen.   Cathyann Cobia, CMA

## 2024-01-11 ENCOUNTER — Other Ambulatory Visit

## 2024-01-11 LAB — CERVICOVAGINAL ANCILLARY ONLY
Bacterial Vaginitis (gardnerella): POSITIVE — AB
Candida Glabrata: NEGATIVE
Candida Vaginitis: NEGATIVE
Chlamydia: POSITIVE — AB
Comment: NEGATIVE
Comment: NEGATIVE
Comment: NEGATIVE
Comment: NEGATIVE
Comment: NEGATIVE
Comment: NORMAL
Neisseria Gonorrhea: NEGATIVE
Trichomonas: NEGATIVE

## 2024-01-13 ENCOUNTER — Other Ambulatory Visit: Payer: Self-pay | Admitting: Licensed Practical Nurse

## 2024-01-13 DIAGNOSIS — A749 Chlamydial infection, unspecified: Secondary | ICD-10-CM

## 2024-01-13 DIAGNOSIS — N760 Acute vaginitis: Secondary | ICD-10-CM

## 2024-01-13 MED ORDER — DOXYCYCLINE HYCLATE 100 MG PO CAPS: 100.0000 mg | ORAL_CAPSULE | Freq: Two times a day (BID) | ORAL | 0 refills | Status: DC

## 2024-01-13 MED ORDER — METRONIDAZOLE 0.75 % VA GEL: 1.0000 | Freq: Every day | VAGINAL | 1 refills | Status: DC

## 2024-01-15 ENCOUNTER — Ambulatory Visit: Payer: Self-pay | Attending: Medical

## 2024-01-17 ENCOUNTER — Ambulatory Visit: Admitting: Medical

## 2024-02-08 ENCOUNTER — Ambulatory Visit: Admitting: Obstetrics

## 2024-02-08 ENCOUNTER — Ambulatory Visit

## 2024-02-14 ENCOUNTER — Other Ambulatory Visit (HOSPITAL_COMMUNITY)
Admission: RE | Admit: 2024-02-14 | Discharge: 2024-02-14 | Disposition: A | Discharge status: Home / Self Care | Source: Ambulatory Visit | Attending: Obstetrics | Admitting: Obstetrics

## 2024-02-14 ENCOUNTER — Ambulatory Visit (INDEPENDENT_AMBULATORY_CARE_PROVIDER_SITE_OTHER)

## 2024-02-14 VITALS — BP 133/82 | HR 59 | Ht 63.0 in | Wt 171.7 lb

## 2024-02-14 DIAGNOSIS — Z113 Encounter for screening for infections with a predominantly sexual mode of transmission: Secondary | ICD-10-CM | POA: Diagnosis present

## 2024-02-14 NOTE — Progress Notes (Signed)
    NURSE VISIT NOTE  Subjective:    Patient ID: Julie Potter, female    DOB: [birth year], 23 y.o.   MRN: [id number]  HPI  Patient is a 23 y.o. G68P1021 female who presents for  STD testing via self swab culture and lab work. Denies possible exposure or symptoms at this time. Self swab culture preformed. All questions asked, patient aware office will be in touch with results    Objective:    BP 133/82   Pulse (!) 59   Ht 5\' 3"  (1.6 m)   Wt 171 lb 11.2 oz (77.9 kg)   BMI 30.42 kg/m     Assessment:   1. Screening for STD (sexually transmitted disease)       Plan:   GC and chlamydia DNA  probe sent to lab. Treatment: await results for further treatment if needed ROV prn if symptoms persist or worsen.   Vale Garrison, CMA

## 2024-02-15 LAB — RPR: RPR Ser Ql: NONREACTIVE

## 2024-02-15 LAB — HEPATITIS C ANTIBODY: Hep C Virus Ab: NONREACTIVE

## 2024-02-15 LAB — CERVICOVAGINAL ANCILLARY ONLY
Bacterial Vaginitis (gardnerella): NEGATIVE
Candida Glabrata: NEGATIVE
Candida Vaginitis: NEGATIVE
Chlamydia: POSITIVE — AB
Comment: NEGATIVE
Comment: NEGATIVE
Comment: NEGATIVE
Comment: NEGATIVE
Comment: NEGATIVE
Comment: NORMAL
Neisseria Gonorrhea: NEGATIVE
Trichomonas: NEGATIVE

## 2024-02-15 LAB — HIV ANTIBODY (ROUTINE TESTING W REFLEX): HIV Screen 4th Generation wRfx: NONREACTIVE

## 2024-02-15 LAB — HEPATITIS B SURFACE ANTIGEN: Hepatitis B Surface Ag: NEGATIVE

## 2024-02-16 ENCOUNTER — Ambulatory Visit: Payer: Self-pay | Admitting: Licensed Practical Nurse

## 2024-02-16 ENCOUNTER — Other Ambulatory Visit: Payer: Self-pay | Admitting: Licensed Practical Nurse

## 2024-02-16 DIAGNOSIS — A749 Chlamydial infection, unspecified: Secondary | ICD-10-CM

## 2024-02-16 MED ORDER — AZITHROMYCIN 500 MG PO TABS
1000.0000 mg | ORAL_TABLET | Freq: Once | ORAL | 0 refills | Status: AC
Start: 2024-02-16 — End: 2024-02-16

## 2024-02-16 NOTE — Progress Notes (Signed)
 Patient was called. Patient is aware. She has been treated. [facility] has been notified.

## 2024-02-16 NOTE — Progress Notes (Signed)
 TC to Julie Potter, The Swab collected shows positive for chlamydia. Julie Potter reports she took Doxy as instructed in the beginning of May, she has not had sex since then, Reviewed this could be a false positive offered retest or medication. Julie Potter prefers to take medication. Will treat with Azithromycin  and retest in 4 weeks Script sent to pharmacy on file Julie Potter   [facility]  02/16/24  2:44 PM

## 2024-02-28 ENCOUNTER — Ambulatory Visit: Payer: Self-pay

## 2024-02-29 ENCOUNTER — Ambulatory Visit: Admitting: Medical

## 2024-03-13 ENCOUNTER — Ambulatory Visit: Attending: Medical

## 2024-03-27 ENCOUNTER — Ambulatory Visit

## 2024-03-27 ENCOUNTER — Ambulatory Visit: Admitting: Medical

## 2024-03-27 NOTE — Progress Notes (Deleted)
    NURSE VISIT NOTE  Subjective:    Patient ID: Julie Potter, female    DOB: 2001/01/27, 23 y.o.   MRN: [id number]  HPI  Patient is a 23 y.o. G40P1021 female who presents for {pe vag discharge desc:315065} vaginal discharge for *** {gen duration:315003}. Denies abnormal vaginal bleeding or significant pelvic pain or fever. {Actions; denies/reports/admits to:19208} {UTI Symptoms:210800002}. Patient {has/denies:315300} history of known exposure to STD.   Objective:    There were no vitals taken for this visit.   @THIS  VISIT ONLY@  Assessment:   No diagnosis found.  {vaginitis type:315262}  Plan:   GC and chlamydia DNA  probe sent to lab. Treatment: {vaginitis tx:315263} ROV prn if symptoms persist or worsen.   Rollo JINNY Maxin, CMA

## 2024-04-03 ENCOUNTER — Ambulatory Visit: Attending: Medical

## 2024-04-09 ENCOUNTER — Encounter (HOSPITAL_BASED_OUTPATIENT_CLINIC_OR_DEPARTMENT_OTHER): Payer: Self-pay

## 2024-04-10 ENCOUNTER — Encounter (HOSPITAL_BASED_OUTPATIENT_CLINIC_OR_DEPARTMENT_OTHER): Payer: Self-pay | Admitting: Internal Medicine

## 2024-04-10 ENCOUNTER — Ambulatory Visit: Admitting: Medical

## 2024-04-10 ENCOUNTER — Ambulatory Visit (HOSPITAL_BASED_OUTPATIENT_CLINIC_OR_DEPARTMENT_OTHER): Admitting: Internal Medicine

## 2024-04-10 ENCOUNTER — Telehealth (HOSPITAL_BASED_OUTPATIENT_CLINIC_OR_DEPARTMENT_OTHER): Payer: Self-pay

## 2024-04-10 VITALS — BP 124/64 | HR 72 | Ht 63.0 in | Wt 168.0 lb

## 2024-04-10 DIAGNOSIS — E7849 Other hyperlipidemia: Secondary | ICD-10-CM

## 2024-04-10 MED ORDER — ROSUVASTATIN CALCIUM 20 MG PO TABS: 20.0000 mg | ORAL_TABLET | Freq: Every day | ORAL | 3 refills | Status: AC

## 2024-04-10 NOTE — Telephone Encounter (Signed)
 Genetic test for Familial Hypercholesterolemia ordered ([facility]) Cheek swab completed in office Specimen and necessary paperwork mailed. ID: [id number]

## 2024-04-10 NOTE — Patient Instructions (Signed)
 Medication Instructions:  STOP Atorvastatin START Rosuvastatin  (CRESTOR ) 20 mg daily  *If you need a refill on your cardiac medications before your next appointment, please call your pharmacy*  Testing/Procedures: Genetic test for Familial Hypercholesterolemia ordered ([facility]) Cheek swab completed in office Specimen and necessary paperwork mailed.    Follow-Up: At [facility], you and your health needs are our priority.  As part of our continuing mission to provide you with exceptional heart care, our providers are all part of one team.  This team includes your primary Cardiologist (physician) and Advanced Practice Providers or APPs (Physician Assistants and Nurse Practitioners) who all work together to provide you with the care you need, when you need it.  Your next appointment:   TBD based on test results  Genetic testing was completed in office. Results take about 2-3 weeks to come back. Dr. Mona will reach out with results.

## 2024-04-10 NOTE — Progress Notes (Signed)
 LIPID CLINIC CONSULT NOTE  Chief Complaint:  Familial hyperlipidemia  Primary Care Physician: Towana Small, FNP  Primary Cardiologist:  None  HPI:  Julie Potter is a 23 y.o. female who is being seen today for the evaluation of FH at the request of Furth, Cadence H, PA-C. 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  She has a strong family history of high cholesterol including her mother who has had apparently a prior stroke and possible silent MI and high cholesterol as well as several other family members with high cholesterol including her brother who was referred to me and seen earlier today.  Her last lipid profile showed total cholesterol 380, HDL 39, triglycerides 194 and LDL 300.  She had previously been on atorvastatin and felt that it might be giving her some side effects and stopped the medication but does not necessarily note that the symptoms got better.  Primarily the side effect was headache.  She reports daily chronic headache but has not had that specifically evaluated.  Recently she saw our cardiology team in [place] and had a evaluation for a heart murmur.  She has an upcoming echocardiogram in September as well as follow-up afterward.  PMHx:  Past Medical History:  Diagnosis Date   Anemia    First degree heart block    Heart murmur     Past Surgical History:  Procedure Laterality Date   NO PAST SURGERIES      FAMHx:  Family History  Problem Relation Age of Onset   Hyperlipidemia Mother    Hyperlipidemia Maternal Grandmother    COPD Maternal Grandmother    Heart disease Maternal Grandmother    Diabetes Paternal Grandfather     SOCHx:   reports that she has never smoked. She has been exposed to tobacco smoke. She has never used smokeless tobacco. She reports that she does not drink alcohol and does not use drugs.  ALLERGIES:  No Known Allergies  ROS: Pertinent items noted in HPI and  remainder of comprehensive ROS otherwise negative.  HOME MEDS: Current Outpatient Medications on File Prior to Visit  Medication Sig Dispense Refill   atorvastatin (LIPITOR) 20 MG tablet Take 20 mg by mouth daily. (Patient not taking: Reported on 04/10/2024)     doxycycline  (VIBRAMYCIN ) 100 MG capsule Take 1 capsule (100 mg total) by mouth 2 (two) times daily. (Patient not taking: Reported on 04/10/2024) 14 capsule 0   metroNIDAZOLE  (METROGEL ) 0.75 % vaginal gel Place 1 Applicatorful vaginally at bedtime. Apply one applicatorful to vagina at bedtime for 5 days (Patient not taking: Reported on 04/10/2024) 70 g 1   No current facility-administered medications on file prior to visit.    LABS/IMAGING: No results found for this or any previous visit (from the past 48 hours). No results found.  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   Ms. [name] has a probable familial hyperlipidemia based on Ray Finical criteria with a very elevated LDL cholesterol.  She may have been intolerant of atorvastatin however it is not totally clear.  Based on that however I would advise trying a different statin and will start rosuvastatin  20 mg daily.  She will likely need that and a PCSK9 inhibitor to reach some target LDL of at least 50%  reduction in cholesterol and goal of 70 mg/dL.  In addition, she would benefit from genetic testing to determine hopefully the exact cause of her high cholesterol.  She is interested in that today.  She understands is typically covered by insurance but if not can lead up to a $299 charge.  This typically takes 2 to 3 weeks to result and I will reach out to her once I have reviewed it via MyChart.  Otherwise repeat lipids including NMR and LP(a) in about 3 months.  Thanks again for the kind referral.  Vinie KYM Maxcy, MD, [facility], FNLA, FACP  [place]  [facility]  Medical Director of the Advanced Lipid Disorders &  Cardiovascular Risk Reduction Clinic Diplomate of the American Board of Clinical Lipidology Attending Cardiologist  Direct Dial: [phone]  Fax: [phone]  Website:  [email or web address] Vinie JAYSON Maxcy 04/10/2024, 4:26 PM

## 2024-04-10 NOTE — Progress Notes (Deleted)
  Cardiology Office Note   Date:  04/10/2024  ID:  Lizvette, Lightsey 09-Jul-2001, MRN [id number] PCP: Towana Small, FNP  [facility] Providers Cardiologist:  None { Click to update primary MD,subspecialty MD or APP then REFRESH:1}    History of Present Illness ERCIE ELIASEN is a 23 y.o. 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   The patient reports heart murmur dating back to age 46-10. Says she wore a heart monitor for a week and had an echo. Care Everywhere shows work-up was at [facility] for pre-syncope/syncope in the setting of murmur. Unable to see results, but notes say EKG showed 1st degree AV block,  echo was normal with no defects, suspected innocent flow murmur. Unable to see echo report or images, unsure what type or where is the murmur.   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    No alcohol, drug, smoking history.   ROS: ***  Studies Reviewed      *** Risk Assessment/Calculations {Does this patient have ATRIAL FIBRILLATION?[phone]} No BP recorded.  {Refresh Note OR Click here to enter BP  :1}***       Physical Exam VS:  There were no vitals taken for this visit.       Wt Readings from Last 3 Encounters:  02/14/24 171 lb 11.2 oz (77.9 kg)  01/10/24 174 lb 1.6 oz (79 kg)  11/23/23 175 lb 12.8 oz (79.7 kg)    GEN: Well nourished, well developed in no acute distress NECK: No JVD; No carotid bruits CARDIAC: ***RRR, no murmurs, rubs, gallops RESPIRATORY:  Clear to auscultation without rales, wheezing or rhonchi  ABDOMEN: Soft, non-tender, non-distended EXTREMITIES:  No edema; No deformity   ASSESSMENT AND PLAN ***    {Are you ordering a CV Procedure (e.g. stress test, cath, DCCV, TEE, etc)?   Press F2        :[id number]}  Dispo: ***  Signed, Gwendolyn Mclees VEAR Fishman, PA-C

## 2024-04-29 ENCOUNTER — Institutional Professional Consult (permissible substitution) (HOSPITAL_BASED_OUTPATIENT_CLINIC_OR_DEPARTMENT_OTHER): Admitting: Internal Medicine

## 2024-05-05 ENCOUNTER — Encounter (HOSPITAL_BASED_OUTPATIENT_CLINIC_OR_DEPARTMENT_OTHER): Payer: Self-pay | Admitting: Internal Medicine

## 2024-05-05 DIAGNOSIS — E7849 Other hyperlipidemia: Secondary | ICD-10-CM

## 2024-05-14 NOTE — Telephone Encounter (Signed)
 Left message for patient on [phone] [facility] Phone) that results have been released to MyChart for review. Call back or message in patient portal with questions/concerns.

## 2024-05-29 ENCOUNTER — Ambulatory Visit: Attending: Medical

## 2024-05-29 DIAGNOSIS — R011 Cardiac murmur, unspecified: Secondary | ICD-10-CM | POA: Diagnosis present

## 2024-05-29 DIAGNOSIS — R0789 Other chest pain: Secondary | ICD-10-CM | POA: Diagnosis not present

## 2024-05-29 LAB — ECHOCARDIOGRAM COMPLETE
AV Mean grad: 7 mmHg
AV Peak grad: 12.4 mmHg
Ao pk vel: 1.76 m/s
Area-P 1/2: 3.91 cm2
S' Lateral: 3.03 cm

## 2024-05-31 ENCOUNTER — Ambulatory Visit: Payer: Self-pay | Admitting: Medical

## 2024-06-26 ENCOUNTER — Ambulatory Visit: Admitting: Medical

## 2024-06-26 NOTE — Progress Notes (Deleted)
  Cardiology Office Note   Date:  06/26/2024  ID:  Julie Potter, DOB 08-Aug-2001, MRN [id number] PCP: Towana Small, FNP  [facility] Providers Cardiologist:  None { Click to update primary MD,subspecialty MD or APP then REFRESH:1}    History of Present Illness Julie Potter is a 23 y.o. 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   The patient reports heart murmur dating back to age 26-10. Says she wore a heart monitor for a week and had an echo. Care Everywhere shows work-up was at [facility] for pre-syncope/syncope in the setting of murmur. Unable to see results, but notes say EKG showed 1st degree AV block,  echo was normal with no defects, suspected innocent flow murmur. Unable to see echo report or images, unsure what type or where is the murmur.   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   The patient was seen 11/23/23 reporting chest pain and SOB. She had a murmur on exam. Echo showed LVEF 60-65%, normal diastolic parameters.   Today   ROS: ***  Studies Reviewed      *** Risk Assessment/Calculations {Does this patient have ATRIAL FIBRILLATION?:[phone]} No BP recorded.  {Refresh Note OR Click here to enter BP  :1}***       Physical Exam VS:  There were no vitals taken for this visit.       Wt Readings from Last 3 Encounters:  04/10/24 168 lb (76.2 kg)  02/14/24 171 lb 11.2 oz (77.9 kg)  01/10/24 174 lb 1.6 oz (79 kg)    GEN: Well nourished, well developed in no acute distress NECK: No JVD; No carotid bruits CARDIAC: ***RRR, no murmurs, rubs, gallops RESPIRATORY:  Clear to auscultation without rales, wheezing or rhonchi  ABDOMEN: Soft, non-tender, non-distended EXTREMITIES:  No edema; No deformity   ASSESSMENT AND PLAN ***    {Are you ordering a CV Procedure (e.g. stress test, cath, DCCV, TEE, etc)?   Press F2        :[id number]}  Dispo:  ***  Signed, Jonothan Heberle VEAR Fishman, PA-C

## 2024-08-29 ENCOUNTER — Emergency Department

## 2024-08-29 ENCOUNTER — Other Ambulatory Visit: Payer: Self-pay

## 2024-08-29 ENCOUNTER — Emergency Department
Admission: EM | Admit: 2024-08-29 | Discharge: 2024-08-29 | Disposition: A | Discharge status: Home / Self Care | Attending: Emergency Medicine | Admitting: Emergency Medicine

## 2024-08-29 DIAGNOSIS — R059 Cough, unspecified: Secondary | ICD-10-CM | POA: Insufficient documentation

## 2024-08-29 DIAGNOSIS — R319 Hematuria, unspecified: Secondary | ICD-10-CM | POA: Insufficient documentation

## 2024-08-29 LAB — URINALYSIS, ROUTINE W REFLEX MICROSCOPIC: RBC / HPF: >50 RBC/hpf (ref 0–5)

## 2024-08-29 LAB — COMPREHENSIVE METABOLIC PANEL WITH GFR
ALT: 14 U/L (ref 0–44)
AST: 27 U/L (ref 15–41)
Albumin: 3.8 g/dL (ref 3.5–5.0)
Alkaline Phosphatase: 61 U/L (ref 38–126)
Anion gap: 10 (ref 5–15)
BUN: 15 mg/dL (ref 6–20)
CO2: 26 mmol/L (ref 22–32)
Calcium: 8.9 mg/dL (ref 8.9–10.3)
Chloride: 102 mmol/L (ref 98–111)
Creatinine, Ser: 1.02 mg/dL — ABNORMAL HIGH (ref 0.44–1.00)
GFR, Estimated: >60 mL/min (ref >60)
Glucose, Bld: 118 mg/dL — ABNORMAL HIGH (ref 70–99)
Potassium: 4.2 mmol/L (ref 3.5–5.1)
Sodium: 138 mmol/L (ref 135–145)
Total Bilirubin: 0.4 mg/dL (ref 0.0–1.2)
Total Protein: 6.9 g/dL (ref 6.5–8.1)

## 2024-08-29 LAB — CBC
HCT: 42.6 % (ref 36.0–46.0)
Hemoglobin: 13.9 g/dL (ref 12.0–15.0)
MCH: 29.3 pg (ref 26.0–34.0)
MCHC: 32.6 g/dL (ref 30.0–36.0)
MCV: 89.7 fL (ref 80.0–100.0)
Platelets: 271 K/uL (ref 150–400)
RBC: 4.75 MIL/uL (ref 3.87–5.11)
RDW: 11.8 % (ref 11.5–15.5)
WBC: 6 K/uL (ref 4.0–10.5)
nRBC: 0 % (ref 0.0–0.2)

## 2024-08-29 LAB — RESP PANEL BY RT-PCR (RSV, FLU A&B, COVID)  RVPGX2
Influenza A by PCR: NEGATIVE
Influenza B by PCR: NEGATIVE
Resp Syncytial Virus by PCR: NEGATIVE
SARS Coronavirus 2 by RT PCR: NEGATIVE

## 2024-08-29 LAB — PREGNANCY, URINE: Preg Test, Ur: NEGATIVE

## 2024-08-29 LAB — LIPASE, BLOOD: Lipase: 34 U/L (ref 11–51)

## 2024-08-29 NOTE — ED Notes (Signed)
 Lab called and they did receive urine and will perform urine pregnacy

## 2024-08-29 NOTE — ED Triage Notes (Signed)
 Pt comes with body aches, red colored urine and lower back pain for two days.

## 2024-08-29 NOTE — Discharge Instructions (Signed)
 We are treating you for possible urinary tract or bladder infection since this is the most common thing that will cause blood in the urine.  Take the antibiotic as prescribed and finish the full course.  Follow-up with your primary care provider.  Return to the ER for new, worsening, or persistent severe bleeding, any other abnormal bleeding or bruising, abdominal pain, fever, vomiting, weakness, or any other new or worsening symptoms that concern you.

## 2024-08-29 NOTE — ED Provider Notes (Signed)
 [facility] Provider Note    Event Date/Time   First MD Initiated Contact with Patient 08/29/24 1104     (approximate)   History   UTI   HPI  Julie Potter is a 23 y.o. female with a history of anemia and hyperlipidemia who presents with generalized bodyaches, bilateral back and flank pain, nonproductive cough, congestion, nausea, and malaise for the last day.  She then developed gross hematuria acutely this morning.  She denies any dysuria or frequency.  She has no significant abdominal pain.  She denies any vaginal bleeding or discharge.  I reviewed the past medical records.  The patient's most recent outpatient encounter was on 7/30 with cardiology for evaluation of hyperlipidemia.   Physical Exam   Triage Vital Signs: ED Triage Vitals  Encounter Vitals Group     BP 08/29/24 1019 (!) 162/102     Girls Systolic BP Percentile --      Girls Diastolic BP Percentile --      Boys Systolic BP Percentile --      Boys Diastolic BP Percentile --      Pulse Rate 08/29/24 1019 65     Resp 08/29/24 1019 18     Temp 08/29/24 1019 98 F (36.7 C)     Temp src --      SpO2 08/29/24 1019 100 %     Weight 08/29/24 1018 170 lb (77.1 kg)     Height 08/29/24 1018 5' 3 (1.6 m)     Head Circumference --      Peak Flow --      Pain Score 08/29/24 1018 0     Pain Loc --      Pain Education --      Exclude from Growth Chart --     Most recent vital signs: Vitals:   08/29/24 1105 08/29/24 1402  BP:  (!) 149/88  Pulse:  72  Resp:  18  Temp:    SpO2: 100% 100%     General: Alert, relatively well-appearing, no distress.  CV:  Good peripheral perfusion.  Resp:  Normal effort.  Abd:  No distention.  Other:  No CVA tenderness.   ED Results / Procedures / Treatments   Labs (all labs ordered are listed, but only abnormal results are displayed) Labs Reviewed  COMPREHENSIVE METABOLIC PANEL WITH GFR - Abnormal; Notable for the following components:       Result Value   Glucose, Bld 118 (*)    Creatinine, Ser 1.02 (*)    All other components within normal limits  URINALYSIS, ROUTINE W REFLEX MICROSCOPIC - Abnormal; Notable for the following components:   Color, Urine RED (*)    APPearance CLOUDY (*)    Glucose, UA   (*)    Value: TEST NOT REPORTED DUE TO COLOR INTERFERENCE OF URINE PIGMENT   Hgb urine dipstick   (*)    Value: TEST NOT REPORTED DUE TO COLOR INTERFERENCE OF URINE PIGMENT   Bilirubin Urine   (*)    Value: TEST NOT REPORTED DUE TO COLOR INTERFERENCE OF URINE PIGMENT   Ketones, ur   (*)    Value: TEST NOT REPORTED DUE TO COLOR INTERFERENCE OF URINE PIGMENT   Protein, ur   (*)    Value: TEST NOT REPORTED DUE TO COLOR INTERFERENCE OF URINE PIGMENT   Nitrite   (*)    Value: TEST NOT REPORTED DUE TO COLOR INTERFERENCE OF URINE PIGMENT   Leukocytes,Ua   (*)  Value: TEST NOT REPORTED DUE TO COLOR INTERFERENCE OF URINE PIGMENT   Bacteria, UA MANY (*)    All other components within normal limits  RESP PANEL BY RT-PCR (RSV, FLU A&B, COVID)  RVPGX2  LIPASE, BLOOD  CBC  PREGNANCY, URINE     EKG     RADIOLOGY  CT renal stone study: I independently viewed and interpreted the images; there is no ureteral stone.  Radiology report indicates no acute abnormality.  PROCEDURES:  Critical Care performed: No  Procedures   MEDICATIONS ORDERED IN ED: Medications - No data to display   IMPRESSION / MDM / ASSESSMENT AND PLAN / ED COURSE  I reviewed the triage vital signs and the nursing notes.   Differential diagnosis includes, but is not limited to:  Bodyaches, nausea, URI symptoms, malaise: Influenza, COVID-19, other viral syndrome, dehydration.  Hematuria: UTI, cystitis, ureteral stone.  We will obtain basic labs, respiratory panel, urinalysis, CT renal stone study, and reassess.  Patient's presentation is most consistent with acute complicated illness / injury requiring diagnostic  workup.  ----------------------------------------- 1:42 PM on 08/29/2024 -----------------------------------------  Urinalysis shows significant RBCs, WBCs, and many bacteria.  CT is negative for ureteral stone or other acute findings.  Overall I suspect that the patient has acute cystitis causing the hematuria.  BMP and CBC are unremarkable.  The patient appears comfortable.  She is appropriate for outpatient treatment.  She is stable for discharge at this time.  She needs to go pick up her child, so will not wait for the results of the respiratory panel but will check them in MyChart.  I gave strict return precautions, she expressed understanding.   FINAL CLINICAL IMPRESSION(S) / ED DIAGNOSES   Final diagnoses:  Hematuria, unspecified type     Rx / DC Orders   ED Discharge Orders          Ordered    cephALEXin (KEFLEX) 500 MG capsule  2 times daily        08/29/24 1342             Note:  This document was prepared using Dragon voice recognition software and may include unintentional dictation errors.    Jacolyn Pae, MD 08/29/24 [phone]

## 2024-10-02 ENCOUNTER — Ambulatory Visit (INDEPENDENT_AMBULATORY_CARE_PROVIDER_SITE_OTHER)

## 2024-10-02 ENCOUNTER — Ambulatory Visit

## 2024-10-02 VITALS — BP 138/82 | HR 64 | Wt 175.0 lb

## 2024-10-02 DIAGNOSIS — O3680X0 Pregnancy with inconclusive fetal viability, not applicable or unspecified: Secondary | ICD-10-CM

## 2024-10-02 DIAGNOSIS — N912 Amenorrhea, unspecified: Secondary | ICD-10-CM | POA: Diagnosis not present

## 2024-10-02 DIAGNOSIS — Z3201 Encounter for pregnancy test, result positive: Secondary | ICD-10-CM

## 2024-10-02 LAB — POCT URINE PREGNANCY: Preg Test, Ur: POSITIVE — AB

## 2024-10-02 NOTE — Progress Notes (Signed)
" ° ° °  NURSE VISIT NOTE  Subjective:    Patient ID: Julie Potter, female    DOB: 05-20-2001, 24 y.o.   MRN: [id number]  HPI  Patient is a 24 y.o. [id number] female who presents for evaluation of amenorrhea. She believes she could be pregnant. Pregnancy is desired. Sexual Activity: has sex with males. Current symptoms also include: positive home pregnancy test. Last period was normal.    Objective:    BP 138/82   Pulse 64   Wt 175 lb (79.4 kg)   LMP 08/17/2024 (Exact Date)   BMI 31.00 kg/m   Lab Review  Results for orders placed or performed in visit on 10/02/24  POCT urine pregnancy  Result Value Ref Range   Preg Test, Ur Positive (A) Negative    Assessment:   1. Amenorrhea   2. Encounter to determine fetal viability of pregnancy, single or unspecified fetus     Plan:   Pregnancy Test: Positive  Estimated Date of Delivery: 05/24/25 Encouraged well-balanced diet, plenty of rest when needed, pre-natal vitamins daily and walking for exercise.  Discussed self-help for nausea, avoiding OTC medications until consulting provider or pharmacist, other than Tylenol  as needed, minimal caffeine (1-2 cups daily) and avoiding alcohol.   She will schedule her nurse visit @ 7-[redacted] wks pregnant, u/s for dating @10  wk, and NOB visit at [redacted] wk pregnant.    Feel free to call with any questions.  Beola Skeens, CMA   "

## 2024-10-02 NOTE — Patient Instructions (Signed)
 First Trimester of Pregnancy  The first trimester of pregnancy starts on the first day of your last monthly period until the end of week 13. This is months 1 through 3 of pregnancy. A week after a sperm fertilizes an egg, the egg will implant into the wall of the uterus and begin to develop into a baby. Body changes during your first trimester Your body goes through many changes during pregnancy. The changes usually return to normal after your baby is born. Physical changes Your breasts may grow larger and may hurt. The area around your nipples may get darker. Your periods will stop. Your hair and nails may grow faster. You may pee more often. Health changes You may tire easily. Your gums may bleed and may be sensitive when you brush and floss. You may not feel hungry. You may have heartburn. You may throw up or feel like you may throw up. You may want to eat some foods, but not others. You may have headaches. You may have trouble pooping (constipation). Other changes Your emotions may change from day to day. You may have more dreams. Follow these instructions at home: Medicines Talk to your health care provider if you're taking medicines. Ask if the medicines are safe to take during pregnancy. Your provider may change the medicines that you take. Do not take any medicines unless told to by your provider. Take a prenatal vitamin that has at least 600 micrograms (mcg) of folic acid. Do not use herbal medicines, illegal substances, or medicines that are not approved by your provider. Eating and drinking While you're pregnant your body needs extra food for your growing baby. Talk with your provider about what to eat while pregnant. Activity Most women are able to exercise during pregnancy. Exercises may need to change as your pregnancy goes on. Talk to your provider about your activities and exercise routines. Relieving pain and discomfort Wear a good, supportive bra if your breasts  hurt. Rest with your legs raised if you have leg cramps or low back pain. Safety Wear your seatbelt at all times when you're in a car. Talk to your provider if someone hits you, hurts you, or yells at you. Talk with your provider if you're feeling sad or have thoughts of hurting yourself. Lifestyle Certain things can be harmful while you're pregnant. Follow these rules: Do not use hot tubs, steam rooms, or saunas. Do not douche. Do not use tampons or scented pads. Do not drink alcohol,smoke, vape, or use products with nicotine or tobacco in them. If you need help quitting, talk with your provider. Avoid cat litter boxes and soil used by cats. These things carry germs that can cause harm to your pregnancy and your baby. General instructions Keep all follow-up visits. It helps you and your unborn baby stay as healthy as possible. Write down your questions. Take them to your visits. Your provider will: Talk with you about your overall health. Give you advice or refer you to specialists who can help with different needs, including: Prenatal education classes. Mental health and counseling. Foods and healthy eating. Ask for help if you need help with food. Call your dentist and ask to be seen. Brush your teeth with a soft toothbrush. Floss gently. Where to find more information American Pregnancy Association: americanpregnancy.org [employer]: acog.org Office on [employer] Health: [employer] Contact a health care provider if: You feel dizzy, faint, or have a fever. You vomit or have watery poop (diarrhea) for 2  days or more. You have abnormal discharge or bleeding from your vagina. You have pain when you pee or your pee smells bad. You have cramps, pain, or pressure in your belly area. Get help right away if: You have trouble breathing or chest pain. You have any kind of injury, such as from a fall or a car crash. These symptoms may be an  emergency. Get help right away. Call 911. Do not wait to see if the symptoms will go away. Do not drive yourself to the hospital. This information is not intended to replace advice given to you by your health care provider. Make sure you discuss any questions you have with your health care provider. Document Revised: 06/01/2023 Document Reviewed: 12/30/2022 Elsevier Patient Education  2024 Elsevier Inc.   Common Medications Safe in Pregnancy  Acne:      Constipation:  Benzoyl Peroxide     Colace  Clindamycin      Dulcolax Suppository  Topica Erythromycin     Fibercon  Salicylic Acid      Metamucil         Miralax AVOID:        Senakot   Accutane    Cough:  Retin-A       Cough Drops  Tetracycline      Phenergan w/ Codeine if Rx  Minocycline      Robitussin (Plain & DM)  Antibiotics:     Crabs/Lice:  Ceclor       RID  Cephalosporins    AVOID:  E-Mycins      Kwell  Keflex  Macrobid/Macrodantin   Diarrhea:  Penicillin      Kao-Pectate  Zithromax      Imodium AD         PUSH FLUIDS AVOID:       Cipro     Fever:  Tetracycline      Tylenol (Regular or Extra  Minocycline       Strength)  Levaquin      Extra Strength-Do not          Exceed 8 tabs/24 hrs Caffeine:        200mg /day (equiv. To 1 cup of coffee or  approx. 3 12 oz sodas)         Gas: Cold/Hayfever:       Gas-X  Benadryl      Mylicon  Claritin       Phazyme  **Claritin-D        Chlor-Trimeton    Headaches:  Dimetapp      ASA-Free Excedrin  Drixoral-Non-Drowsy     Cold Compress  Mucinex (Guaifenasin)     Tylenol (Regular or Extra  Sudafed/Sudafed-12 Hour     Strength)  **Sudafed PE Pseudoephedrine   Tylenol Cold & Sinus     Vicks Vapor Rub  Zyrtec  **AVOID if Problems With Blood Pressure         Heartburn: Avoid lying down for at least 1 hour after meals  Aciphex      Maalox     Rash:  Milk of Magnesia     Benadryl    Mylanta       1% Hydrocortisone Cream  Pepcid  Pepcid Complete   Sleep  Aids:  Prevacid      Ambien   Prilosec       Benadryl  Rolaids       Chamomile Tea  Tums (Limit 4/day)     Unisom  Tylenol PM         Warm milk-add vanilla or  Hemorrhoids:       Sugar for taste  Anusol/Anusol H.C.  (RX: Analapram 2.5%)  Sugar Substitutes:  Hydrocortisone OTC     Ok in moderation  Preparation H      Tucks        Vaseline lotion applied to tissue with wiping    Herpes:     Throat:  Acyclovir      Oragel  Famvir  Valtrex     Vaccines:         Flu Shot Leg Cramps:       *Gardasil  Benadryl      Hepatitis A         Hepatitis B Nasal Spray:       Pneumovax  Saline Nasal Spray     Polio Booster         Tetanus Nausea:       Tuberculosis test or PPD  Vitamin B6 25 mg TID   AVOID:    Dramamine      *Gardasil  Emetrol       Live Poliovirus  Ginger Root 250 mg QID    MMR (measles, mumps &  High Complex Carbs @ Bedtime    rebella)  Sea Bands-Accupressure    Varicella (Chickenpox)  Unisom 1/2 tab TID     *No known complications           If received before Pain:         Known pregnancy;   Darvocet       Resume series after  Lortab        Delivery  Percocet    Yeast:   Tramadol      Femstat  Tylenol 3      Gyne-lotrimin  Ultram       Monistat  Vicodin           MISC:         All Sunscreens           Hair Coloring/highlights          Insect Repellant's          (Including DEET)         Mystic Tans   Commonly Asked Questions During Pregnancy   Cats: A parasite can be excreted in cat feces.  To avoid exposure you need to have another person empty the little box.  If you must empty the litter box you will need to wear gloves.  Wash your hands after handling your cat.  This parasite can also be found in raw or undercooked meat so this should also be avoided.  Colds, Sore Throats, Flu: Please check your medication sheet to see what you can take for symptoms.  If your symptoms are unrelieved by these medications please call the office.  Dental Work: Most  any dental work Agricultural consultant recommends is permitted.  X-rays should only be taken during the first trimester if absolutely necessary.  Your abdomen should be shielded with a lead apron during all x-rays.  Please notify your provider prior to receiving any x-rays.  Novocaine is fine; gas is not recommended.  If your dentist requires a note from [place] prior to dental work please call the office and we will provide one for you.  Exercise: Exercise is an important part of staying healthy during your pregnancy.  You may continue most exercises you were accustomed to prior to pregnancy.  Later in your pregnancy you will most likely notice you have difficulty with activities requiring balance like riding a bicycle.  It is important that you listen to your body and avoid activities that put you at a higher risk of falling.  Adequate rest and staying well hydrated are a must!  If you have questions about the safety of specific activities ask your provider.    Exposure to Children with illness: Try to avoid obvious exposure; report any symptoms to [place] when noted,  If you have chicken pos, red measles or mumps, you should be immune to these diseases.   Please do not take any vaccines while pregnant unless you have checked with your OB provider.  Fetal Movement: After 28 weeks we recommend you do "kick counts" twice daily.  Lie or sit down in a calm quiet environment and count your baby movements "kicks".  You should feel your baby at least 10 times per hour.  If you have not felt 10 kicks within the first hour get up, walk around and have something sweet to eat or drink then repeat for an additional hour.  If count remains less than 10 per hour notify your provider.  Fumigating: Follow your pest control agent's advice as to how long to stay out of your home.  Ventilate the area well before re-entering.  Hemorrhoids:   Most over-the-counter preparations can be used during pregnancy.  Check your medication to see what is  safe to use.  It is important to use a stool softener or fiber in your diet and to drink lots of liquids.  If hemorrhoids seem to be getting worse please call the office.   Hot Tubs:  Hot tubs Jacuzzis and saunas are not recommended while pregnant.  These increase your internal body temperature and should be avoided.  Intercourse:  Sexual intercourse is safe during pregnancy as long as you are comfortable, unless otherwise advised by your provider.  Spotting may occur after intercourse; report any bright red bleeding that is heavier than spotting.  Labor:  If you know that you are in labor, please go to the hospital.  If you are unsure, please call the office and let us help you decide what to do.  Lifting, straining, etc:  If your job requires heavy lifting or straining please check with your provider for any limitations.  Generally, you should not lift items heavier than that you can lift simply with your hands and arms (no back muscles)  Painting:  Paint fumes do not harm your pregnancy, but may make you ill and should be avoided if possible.  Latex or water based paints have less odor than oils.  Use adequate ventilation while painting.  Permanents & Hair Color:  Chemicals in hair dyes are not recommended as they cause increase hair dryness which can increase hair loss during pregnancy.  " Highlighting" and permanents are allowed.  Dye may be absorbed differently and permanents may not hold as well during pregnancy.  Sunbathing:  Use a sunscreen, as skin burns easily during pregnancy.  Drink plenty of fluids; avoid over heating.  Tanning Beds:  Because their possible side effects are still unknown, tanning beds are not recommended.  Ultrasound Scans:  Routine ultrasounds are performed at approximately 20 weeks.  You will be able to see your baby's general anatomy an if you would like to know the gender this can usually be determined as well.  If it is questionable when you conceived you may  also  receive an ultrasound early in your pregnancy for dating purposes.  Otherwise ultrasound exams are not routinely performed unless there is a medical necessity.  Although you can request a scan we ask that you pay for it when conducted because insurance does not cover " patient request" scans.  Work: If your pregnancy proceeds without complications you may work until your due date, unless your physician or employer advises otherwise.  Round Ligament Pain/Pelvic Discomfort:  Sharp, shooting pains not associated with bleeding are fairly common, usually occurring in the second trimester of pregnancy.  They tend to be worse when standing up or when you remain standing for long periods of time.  These are the result of pressure of certain pelvic ligaments called "round ligaments".  Rest, Tylenol and heat seem to be the most effective relief.  As the womb and fetus grow, they rise out of the pelvis and the discomfort improves.  Please notify the office if your pain seems different than that described.  It may represent a more serious condition.

## 2024-10-11 ENCOUNTER — Telehealth

## 2024-10-11 DIAGNOSIS — Z3480 Encounter for supervision of other normal pregnancy, unspecified trimester: Secondary | ICD-10-CM | POA: Insufficient documentation

## 2024-10-11 NOTE — Progress Notes (Signed)
 New OB Intake  I connected with  Julie Potter on 10/11/24 at  1:15 PM EST by MyChart Video Visit and verified that I am speaking with the correct person using two identifiers. Nurse is located at [facility] and pt is located at home.  I discussed the limitations, risks, security and privacy concerns of performing an evaluation and management service by telephone and the availability of in person appointments. I also discussed with the patient that there may be a patient responsible charge related to this service. The patient expressed understanding and agreed to proceed.  I explained I am completing New OB Intake today. We discussed her EDD of 05/24/25 that is based on LMP of 08/17/24. Pt is G4/P1. I reviewed her allergies, medications, Medical/Surgical/OB history, and appropriate screenings. There are cats in the home: no. Based on history, this is a/an pregnancy uncomplicated . Her obstetrical history is significant for N/A.  Patient Active Problem List   Diagnosis Date Noted   Supervision of other normal pregnancy, antepartum 10/11/2024   Insomnia 02/21/2018   First degree heart block 11/02/2013   Palpitations 11/02/2013   CARDIAC MURMUR 11/29/2007    Concerns addressed today: None  Delivery Plans:  Plans to deliver at [facility].  Anatomy US  Explained first scheduled US  will be 10/30/24. Anatomy US  will be scheduled around [redacted] weeks gestational age.  Labs Discussed genetic screening with patient. Patient desires genetic testing to be drawn at new OB visit. Discussed possible labs to be drawn at new OB appointment.  COVID Vaccine Patient has had COVID vaccine.   Social Determinants of Health Food Insecurity: denies food insecurity Transportation: Patient denies transportation needs. Childcare: Discussed no children allowed at ultrasound appointments.   First visit review I reviewed new OB appt with pt. I explained she will have blood work and pap  smear/pelvic exam if indicated. Explained pt will be seen by Eleanor Canny, CNM at first visit; encounter routed to appropriate provider.   Beola Skeens, CMA 10/11/2024  1:31 PM

## 2024-10-11 NOTE — Patient Instructions (Signed)
 First Trimester of Pregnancy: What to Know  The first trimester of pregnancy starts on the first day of your last monthly period until the end of week 13. This is months 1 through 3 of pregnancy. A week after a sperm fertilizes an egg, the egg will implant into the wall of the uterus and begin to develop into a baby. Body changes during your first trimester Your body goes through many changes during pregnancy. The changes usually return to normal after your baby is born. Physical changes Your breasts may grow larger and may hurt. The area around your nipples may get darker. Your periods will stop. Your hair and nails may grow faster. You may pee more often. Health changes You may tire easily. Your gums may bleed and may be sensitive when you brush and floss. You may not feel hungry. You may have heartburn. You may throw up or feel like you may throw up. You may want to eat some foods, but not others. You may have headaches. You may have trouble pooping (constipation). Other changes Your emotions may change from day to day. You may have more dreams. Follow these instructions at home: Medicines Talk to your health care provider if you're taking medicines. Ask if the medicines are safe to take during pregnancy. Your provider may change the medicines that you take. Do not take any medicines unless told to by your provider. Take a prenatal vitamin that has at least 600 micrograms (mcg) of folic acid. Do not use herbal medicines, illegal substances, or medicines that are not approved by your provider. Eating and drinking While you're pregnant your body needs extra food for your growing baby. Talk with your provider about what to eat while pregnant. Activity Most women are able to exercise during pregnancy. Exercises may need to change as your pregnancy goes on. Talk to your provider about your activities and exercise routines. Relieving pain and discomfort Wear a good, supportive bra if  your breasts hurt. Rest with your legs raised if you have leg cramps or low back pain. Safety Wear your seatbelt at all times when you're in a car. Talk to your provider if someone hits you, hurts you, or yells at you. Talk with your provider if you're feeling sad or have thoughts of hurting yourself. Lifestyle Certain things can be harmful while you're pregnant. Follow these rules: Do not use hot tubs, steam rooms, or saunas. Do not douche. Do not use tampons or scented pads. Do not drink alcohol,smoke, vape, or use products with nicotine or tobacco in them. If you need help quitting, talk with your provider. Avoid cat litter boxes and soil used by cats. These things carry germs that can cause harm to your pregnancy and your baby. General instructions Keep all follow-up visits. It helps you and your unborn baby stay as healthy as possible. Write down your questions. Take them to your visits. Your provider will: Talk with you about your overall health. Give you advice or refer you to specialists who can help with different needs, including: Prenatal education classes. Mental health and counseling. Foods and healthy eating. Ask for help if you need help with food. Call your dentist and ask to be seen. Brush your teeth with a soft toothbrush. Floss gently. Where to find more information American Pregnancy Association: americanpregnancy.org [employer]: acog.org Office on [employer] Health: [place].ca Contact a health care provider if: You feel dizzy, faint, or have a fever. You vomit or have watery poop (  diarrhea) for 2 days or more. You have abnormal discharge or bleeding from your vagina. You have pain when you pee or your pee smells bad. You have cramps, pain, or pressure in your belly area. Get help right away if: You have trouble breathing or chest pain. You have any kind of injury, such as from a fall or a car crash. These symptoms may  be an emergency. Get help right away. Call 911. Do not wait to see if the symptoms will go away. Do not drive yourself to the hospital. This information is not intended to replace advice given to you by your health care provider. Make sure you discuss any questions you have with your health care provider. Document Revised: 07/08/2024 Document Reviewed: 12/30/2022 Elsevier Patient Education  2025 [employer].  Common Medications Safe in Pregnancy  Acne:      Constipation:  Benzoyl Peroxide     Colace  Clindamycin      Dulcolax Suppository  Topica Erythromycin     Fibercon  Salicylic Acid      Metamucil         Miralax  AVOID:        Senakot   Accutane    Cough:  Retin-A       Cough Drops  Tetracycline      Phenergan  w/ Codeine  if Rx  Minocycline      Robitussin (Plain & DM)  Antibiotics:     Crabs/Lice:  Ceclor       RID  Cephalosporins    AVOID:  E-Mycins      Kwell  Keflex  Macrobid /Macrodantin    Diarrhea:  Penicillin      Kao-Pectate  Zithromax       Imodium AD         PUSH FLUIDS AVOID:       Cipro     Fever:  Tetracycline      Tylenol  (Regular or Extra  Minocycline       Strength)  Levaquin      Extra Strength-Do not          Exceed 8 tabs/24 hrs Caffeine:        200mg /day (equiv. To 1 cup of coffee or  approx. 3 12 oz sodas)         Gas: Cold/Hayfever:       Gas-X  Benadryl       Mylicon  Claritin       Phazyme  **Claritin-D        Chlor-Trimeton    Headaches:  Dimetapp      ASA-Free Excedrin  Drixoral-Non-Drowsy     Cold Compress  Mucinex (Guaifenasin)     Tylenol  (Regular or Extra  Sudafed/Sudafed-12 Hour     Strength)  **Sudafed PE Pseudoephedrine   Tylenol  Cold & Sinus     Vicks Vapor Rub  Zyrtec  **AVOID if Problems With Blood Pressure         Heartburn: Avoid lying down for at least 1 hour after meals  Aciphex      Maalox     Rash:  Milk of Magnesia     Benadryl     Mylanta       1% Hydrocortisone Cream  Pepcid  Pepcid Complete   Sleep  Aids:  Prevacid      Ambien    Prilosec       Benadryl   Rolaids       Chamomile Tea  Tums (Limit 4/day)     Unisom  Tylenol  PM         Warm milk-add vanilla or  Hemorrhoids:       Sugar for taste  Anusol/Anusol H.C.  (RX: Analapram 2.5%)  Sugar Substitutes:  Hydrocortisone OTC     Ok in moderation  Preparation H      Tucks        Vaseline lotion applied to tissue with wiping    Herpes:     Throat:  Acyclovir      Oragel  Famvir  Valtrex     Vaccines:         Flu Shot Leg Cramps:       *Gardasil  Benadryl       Hepatitis A         Hepatitis B Nasal Spray:       Pneumovax  Saline Nasal Spray     Polio Booster         Tetanus Nausea:       Tuberculosis test or PPD  Vitamin B6 25 mg TID   AVOID:    Dramamine      *Gardasil  Emetrol       Live Poliovirus  Ginger Root 250 mg QID    MMR (measles, mumps &  High Complex Carbs @ Bedtime    rebella)  Sea Bands-Accupressure    Varicella (Chickenpox)  Unisom 1/2 tab TID     *No known complications           If received before Pain:         Known pregnancy;   Darvocet       Resume series after  Lortab        Delivery  Percocet    Yeast:   Tramadol      Femstat  Tylenol  3      Gyne-lotrimin  Ultram       Monistat  Vicodin           MISC:         All Sunscreens           Hair Coloring/highlights          Insect Repellant's          (Including DEET)         Mystic Tans   Commonly Asked Questions During Pregnancy  Cats: A parasite can be excreted in cat feces.  To avoid exposure you need to have another person empty the little box.  If you must empty the litter box you will need to wear gloves.  Wash your hands after handling your cat.  This parasite can also be found in raw or undercooked meat so this should also be avoided.  Colds, Sore Throats, Flu: Please check your medication sheet to see what you can take for symptoms.  If your symptoms are unrelieved by these medications please call the office.  Dental Work: Most  any dental work agricultural consultant recommends is permitted.  X-rays should only be taken during the first trimester if absolutely necessary.  Your abdomen should be shielded with a lead apron during all x-rays.  Please notify your provider prior to receiving any x-rays.  Novocaine is fine; gas is not recommended.  If your dentist requires a note from us  prior to dental work please call the office and we will provide one for you.  Exercise: Exercise is an important part of staying healthy during your pregnancy.  You may continue most exercises you were accustomed to prior to pregnancy.  Later  in your pregnancy you will most likely notice you have difficulty with activities requiring balance like riding a bicycle.  It is important that you listen to your body and avoid activities that put you at a higher risk of falling.  Adequate rest and staying well hydrated are a must!  If you have questions about the safety of specific activities ask your provider.    Exposure to Children with illness: Try to avoid obvious exposure; report any symptoms to us  when noted,  If you have chicken pos, red measles or mumps, you should be immune to these diseases.   Please do not take any vaccines while pregnant unless you have checked with your OB provider.  Fetal Movement: After 28 weeks we recommend you do kick counts twice daily.  Lie or sit down in a calm quiet environment and count your baby movements kicks.  You should feel your baby at least 10 times per hour.  If you have not felt 10 kicks within the first hour get up, walk around and have something sweet to eat or drink then repeat for an additional hour.  If count remains less than 10 per hour notify your provider.  Fumigating: Follow your pest control agent's advice as to how long to stay out of your home.  Ventilate the area well before re-entering.  Hemorrhoids:   Most over-the-counter preparations can be used during pregnancy.  Check your medication to see what is  safe to use.  It is important to use a stool softener or fiber in your diet and to drink lots of liquids.  If hemorrhoids seem to be getting worse please call the office.   Hot Tubs:  Hot tubs Jacuzzis and saunas are not recommended while pregnant.  These increase your internal body temperature and should be avoided.  Intercourse:  Sexual intercourse is safe during pregnancy as long as you are comfortable, unless otherwise advised by your provider.  Spotting may occur after intercourse; report any bright red bleeding that is heavier than spotting.  Labor:  If you know that you are in labor, please go to the hospital.  If you are unsure, please call the office and let us  help you decide what to do.  Lifting, straining, etc:  If your job requires heavy lifting or straining please check with your provider for any limitations.  Generally, you should not lift items heavier than that you can lift simply with your hands and arms (no back muscles)  Painting:  Paint fumes do not harm your pregnancy, but may make you ill and should be avoided if possible.  Latex or water based paints have less odor than oils.  Use adequate ventilation while painting.  Permanents & Hair Color:  Chemicals in hair dyes are not recommended as they cause increase hair dryness which can increase hair loss during pregnancy.   Highlighting and permanents are allowed.  Dye may be absorbed differently and permanents may not hold as well during pregnancy.  Sunbathing:  Use a sunscreen, as skin burns easily during pregnancy.  Drink plenty of fluids; avoid over heating.  Tanning Beds:  Because their possible side effects are still unknown, tanning beds are not recommended.  Ultrasound Scans:  Routine ultrasounds are performed at approximately 20 weeks.  You will be able to see your baby's general anatomy an if you would like to know the gender this can usually be determined as well.  If it is questionable when you conceived you may  also receive  an ultrasound early in your pregnancy for dating purposes.  Otherwise ultrasound exams are not routinely performed unless there is a medical necessity.  Although you can request a scan we ask that you pay for it when conducted because insurance does not cover  patient request scans.  Work: If your pregnancy proceeds without complications you may work until your due date, unless your physician or employer advises otherwise.  Round Ligament Pain/Pelvic Discomfort:  Sharp, shooting pains not associated with bleeding are fairly common, usually occurring in the second trimester of pregnancy.  They tend to be worse when standing up or when you remain standing for long periods of time.  These are the result of pressure of certain pelvic ligaments called round ligaments.  Rest, Tylenol  and heat seem to be the most effective relief.  As the womb and fetus grow, they rise out of the pelvis and the discomfort improves.  Please notify the office if your pain seems different than that described.  It may represent a more serious condition.

## 2024-10-30 ENCOUNTER — Other Ambulatory Visit

## 2024-11-13 ENCOUNTER — Encounter: Admitting: Obstetrics
